# Patient Record
Sex: Female | Born: 1935 | Race: White | Hispanic: No | Smoking: Never smoker
Health system: Southern US, Community
[De-identification: ages and names within clinical notes are randomized; demographics above are authoritative.]

## PROBLEM LIST (undated history)

## (undated) DIAGNOSIS — I10 Essential (primary) hypertension: Secondary | ICD-10-CM

## (undated) DIAGNOSIS — I4891 Unspecified atrial fibrillation: Secondary | ICD-10-CM

## (undated) DIAGNOSIS — G4733 Obstructive sleep apnea (adult) (pediatric): Secondary | ICD-10-CM

## (undated) HISTORY — PX: VASCULAR SURGERY: SHX849

## (undated) HISTORY — PX: ABDOMINAL HYSTERECTOMY: SHX81

## (undated) HISTORY — PX: TOTAL KNEE ARTHROPLASTY: SHX125

---

## 1998-07-14 ENCOUNTER — Ambulatory Visit (HOSPITAL_COMMUNITY): Admission: RE | Admit: 1998-07-14 | Discharge: 1998-07-14 | Payer: Self-pay | Admitting: *Deleted

## 2000-01-12 ENCOUNTER — Encounter: Payer: Self-pay | Admitting: *Deleted

## 2000-01-12 ENCOUNTER — Encounter: Admission: RE | Admit: 2000-01-12 | Discharge: 2000-01-12 | Payer: Self-pay | Admitting: *Deleted

## 2001-02-09 ENCOUNTER — Other Ambulatory Visit: Admission: RE | Admit: 2001-02-09 | Discharge: 2001-02-09 | Payer: Self-pay | Admitting: Internal Medicine

## 2002-09-24 ENCOUNTER — Encounter: Payer: Self-pay | Admitting: Emergency Medicine

## 2002-09-24 ENCOUNTER — Emergency Department (HOSPITAL_COMMUNITY): Admission: EM | Admit: 2002-09-24 | Discharge: 2002-09-24 | Payer: Self-pay | Admitting: Emergency Medicine

## 2002-11-18 ENCOUNTER — Inpatient Hospital Stay (HOSPITAL_COMMUNITY): Admission: RE | Admit: 2002-11-18 | Discharge: 2002-11-22 | Payer: Self-pay | Admitting: Orthopedic Surgery

## 2002-11-29 ENCOUNTER — Ambulatory Visit: Admission: RE | Admit: 2002-11-29 | Discharge: 2002-11-29 | Payer: Self-pay | Admitting: Orthopedic Surgery

## 2003-11-19 ENCOUNTER — Ambulatory Visit (HOSPITAL_COMMUNITY): Admission: RE | Admit: 2003-11-19 | Discharge: 2003-11-19 | Payer: Self-pay | Admitting: Orthopedic Surgery

## 2004-09-08 ENCOUNTER — Ambulatory Visit: Payer: Self-pay | Admitting: Cardiology

## 2005-02-02 ENCOUNTER — Inpatient Hospital Stay (HOSPITAL_COMMUNITY): Admission: RE | Admit: 2005-02-02 | Discharge: 2005-02-07 | Payer: Self-pay | Admitting: Orthopedic Surgery

## 2010-06-25 ENCOUNTER — Ambulatory Visit: Payer: Self-pay | Admitting: Diagnostic Radiology

## 2010-06-25 ENCOUNTER — Emergency Department (HOSPITAL_BASED_OUTPATIENT_CLINIC_OR_DEPARTMENT_OTHER): Admission: EM | Admit: 2010-06-25 | Discharge: 2010-06-25 | Payer: Self-pay | Admitting: Emergency Medicine

## 2010-12-23 LAB — CBC
HCT: 44.5 % (ref 36.0–46.0)
Hemoglobin: 15.2 g/dL — ABNORMAL HIGH (ref 12.0–15.0)
MCH: 31.4 pg (ref 26.0–34.0)
MCV: 92 fL (ref 78.0–100.0)
Platelets: 240 10*3/uL (ref 150–400)
RBC: 4.84 MIL/uL (ref 3.87–5.11)
WBC: 7.4 10*3/uL (ref 4.0–10.5)

## 2010-12-23 LAB — BASIC METABOLIC PANEL
BUN: 20 mg/dL (ref 6–23)
CO2: 23 mEq/L (ref 19–32)
Calcium: 9 mg/dL (ref 8.4–10.5)
Chloride: 106 mEq/L (ref 96–112)
Creatinine, Ser: 0.9 mg/dL (ref 0.4–1.2)
GFR calc Af Amer: >60 mL/min (ref >60)
GFR calc non Af Amer: 59 mL/min — ABNORMAL LOW (ref >60)
Glucose, Bld: 93 mg/dL (ref 70–99)
Potassium: 4.8 mEq/L (ref 3.5–5.1)
Sodium: 141 mEq/L (ref 135–145)

## 2010-12-23 LAB — DIFFERENTIAL
Eosinophils Relative: 3 % (ref 0–5)
Lymphocytes Relative: 26 % (ref 12–46)
Lymphs Abs: 1.9 10*3/uL (ref 0.7–4.0)
Monocytes Absolute: 0.6 10*3/uL (ref 0.1–1.0)
Monocytes Relative: 8 % (ref 3–12)

## 2010-12-23 LAB — POCT B-TYPE NATRIURETIC PEPTIDE (BNP): B Natriuretic Peptide, POC: 271 pg/mL — ABNORMAL HIGH (ref 0–100)

## 2010-12-23 LAB — POCT CARDIAC MARKERS
CKMB, poc: <1 ng/mL — ABNORMAL LOW (ref 1.0–8.0)
Troponin i, poc: <0.05 ng/mL (ref 0.00–0.09)

## 2011-02-25 NOTE — H&P (Signed)
Pamela Moody, Pamela Moody            ACCOUNT NO.:  [id number]   MEDICAL RECORD NO.:  [id number]          PATIENT TYPE:  INP   LOCATION:  0468                         FACILITY:  [facility]   PHYSICIAN:  Ollen Gross, M.D.    DATE OF BIRTH:  1921-11-17   DATE OF ADMISSION:  02/02/2005  DATE OF DISCHARGE:                                HISTORY & PHYSICAL   CHIEF COMPLAINT:  Left knee pain.   HISTORY OF PRESENT ILLNESS:  This is an 77 year old female who has been seen  by Dr. Ollen Gross for ongoing knee problems. She has had a previous right  total knee about two years ago and has been quite well with it. Now she is  having continued pain with the left knee. She has known arthritis with  continued pain. She says she has done well with her previous right knee and  she would benefit from undergoing left knee replacement. Risks and benefits  discussed. The patient was subsequently admitted to the hospital.   ALLERGIES:  No known drug allergies. Intolerance to STEROIDS causing nose  swelling and MORPHINE causes nausea and vomiting.   MEDICATIONS:  1.  Diovan 80 mg daily.  2.  Tylenol Arthritis.  3.  Metamucil.   PAST MEDICAL HISTORY:  1.  Hypertension.  2.  Postmenopausal.  3.  History of headaches.  4.  Osteoporosis.   PAST SURGICAL HISTORY:  1.  Varicose vein ligation.  2.  Total hysterectomy.  3.  Right knee replacement.  4.  Right knee arthroscopy x2.   SOCIAL HISTORY:  Widowed, retired, nonsmoker, no alcohol. She lives in  independent living at [facility].   FAMILY HISTORY:  Father deceased at age 22 with a history of prostate  cancer. Mother deceased at age 72 with history of dementia. Hypertension and  arthritis run in the family.   REVIEW OF SYMPTOMS:  GENERAL:  No fever, chills, or night sweats.  NEUROLOGICAL:  No seizures, syncope, or paralysis. RESPIRATORY:  No  shortness of breath at rest. She does have some shortness of breath on  exertion. No productive cough  or hemoptysis. CARDIOVASCULAR:  No chest pain,  angina, or orthopnea. GI:  No nausea, vomiting, diarrhea, or constipation.  GU:  No dysuria, hematuria, or discharge. MUSCULOSKELETAL:  Left knee as  found in the history of present illness.   PHYSICAL EXAMINATION:  VITAL SIGNS:  Pulse 60, respirations 12, blood  pressure 132/82.  GENERAL:  An 77 year old white female well-developed, well-nourished in no  acute distress. Short in stature. She is alert, oriented, and cooperative.  HEENT:  Normocephalic and atraumatic. Oropharynx clear. EOMs intact.  NECK:  Supple. No bruits are appreciated.  CHEST:  Clear anterior and posterior chest walls. No rhonchi, rubs, or  wheezing.  HEART:  Regular rate and rhythm. No murmurs. S1 and S2. No rubs, thrills, or  palpitations.  ABDOMEN:  Soft, slightly round, nontender. Bowel sounds present.  RECTAL:  Not done, not pertinent to present illness.  BREASTS:  Not done, not pertinent to present illness.  GENITALIA:  Not done, not pertinent to present illness.  EXTREMITIES:  Left knee shows range of motion of 0 to 125, marked crepitus,  slight valgus instability.   IMPRESSION:  1.  Osteoarthritis left knee.  2.  Hypertension.  3.  Postmenopausal.  4.  Headaches.  5.  Osteoporosis.   PLAN:  The patient will be admitted to [facility] to undergo a  left total knee arthroplasty. Surgery will be performed by Dr. Ollen Gross.      ALP/MEDQ  D:  02/06/2005  T:  02/06/2005  Job:  [id number]   cc:   Patience Musca, M.D.   Ollen Gross, M.D.  [facility]  [address]  [address]  [place]  [place] [address]  Fax: [phone]

## 2011-02-25 NOTE — Discharge Summary (Signed)
NAMEROSALY, LABARBERA            ACCOUNT NO.:  [id number]   MEDICAL RECORD NO.:  [id number]          PATIENT TYPE:  INP   LOCATION:  0468                         FACILITY:  [facility]   PHYSICIAN:  Ollen Gross, M.D.    DATE OF BIRTH:  Nov 19, 1921   DATE OF ADMISSION:  02/02/2005  DATE OF DISCHARGE:  02/07/2005                                 DISCHARGE SUMMARY   ADMISSION DIAGNOSES:  1.  Osteoarthritis, left knee.  2.  Hypertension.  3.  Postmenopausal.  4.  Headaches.  5.  Osteoporosis.   DISCHARGE DIAGNOSES:  1.  Osteoarthritis, left knee with valgus deformity, status post left total      knee arthroplasty.  2.  Postoperative blood loss anemia.  Did not require transfusion.  3.  Postoperative hyponatremia, improved.  4.  Hypertension.  5.  Postmenopausal.  6.  Headaches.  7.  Osteoporosis.   PROCEDURE:  On February 02, 2005, left total knee arthroplasty.   SURGEON:  Ollen Gross, M.D.   ASSISTANT:  Nadene Rubins, PA-C.   ANESTHESIA:  General with postop Marcaine pain pump.   TOURNIQUET TIME:  Forty-one minutes.   CONSULTATIONS:  None.   BRIEF HISTORY:  Ms. Lewing is an 77 year old female with severe end-stage  arthritis of the left knee with intractable pain.  Previous successful right  total knee.  Now presents for left total knee.   LABORATORY DATA:  CBC:  Hemoglobin 14.1, CBC on admission showed a  hemoglobin of 14.1, hematocrit 41.8.  Differential within normal limits.  Postop hemoglobin 10.3.  Last noted H&H 10 and 29.9.  PT/PTT preop 12.3 and  29, respectively with an INR of 0.9.  Serial pro times were followed.  Last  noted PT/INR 19.1 and 2.  Chem panel on admission all within normal limits.  Postop BMETs were followed.  Sodium dropped from 142 to 134, back up to 137.  Glucose went up from 101 to 140, back down to 119.  Urinalysis, preop,  negative.  Blood group type B-.   EKG:  On February 01, 2005:  Normal sinus rhythm with first-degree AV block and  nonsustained atrial tachycardia.  Confirmed by Dr. Berton Mount.   HOSPITAL COURSE:  Patient was admitted to [facility] and underwent  the above procedure.  Tolerated it well.  Placed on p.o. and IV analgesics.  Was doing well by day #1 and already asking when she could go home.  She  lived at [facility], which did have a skilled area.  She went there last  time with her previous surgery but only stayed there a day or two and then  went back into independent living.  She wants to go straight back into  independent living this time.  Hemovac drain placed at the time of surgery  was pulled on day #1.  PCA was discontinued on day #1.  By day #2, she was  already feeling much better, starting to get up with physical therapy and  ambulating short distances, about 8-10 feet.  Dressing was changed.  The  incision was healing well.  Felt that she  would go to independent living at  [facility] versus their skilled area.  IV fluids were discontinued.  By  day #3, she had a little bit of increased pain, which was probably due to  being up on the knee.  No calf tenderness.  The knee appeared to be  progressing well.  By day #4, slowly progressing with physical therapy.  Ambulating approximately 10 feet twice.  It is felt that she would need a  little bit more time; therefore, her discharge was held until the following  day of Feb 07, 2005.  She was doing much better with physical therapy,  getting up and ambulating approximately 60 feet.  She felt that she had  improvement enough to where she could go back to independent living.  Patient was seen in rounds by Dr. Despina Hick and said she was feeling well, and  she was discharged home.   DISCHARGE PLAN:  1.  Patient was discharged home on Feb 07, 2005.  2.  Discharge diagnoses:  Please see above.  3.  Discharge meds:  Percocet, Robaxin, Coumadin.  4.  Diet as tolerated.  5.  Activity:  Weightbearing as tolerated.  Home health PT/OT and home       health nursing to [facility].  She may start showering.  6.  Follow up on May 9th.  Contact the office at [phone].   DISPOSITION:  Home.   CONDITION ON DISCHARGE:  Improved.      ALP/MEDQ  D:  03/11/2005  T:  03/11/2005  Job:  [id number]   cc:   Dr. Mattie Marlin

## 2011-02-25 NOTE — H&P (Signed)
NAME:  Pamela Moody, Pamela Moody                      ACCOUNT NO.:  [id number]   MEDICAL RECORD NO.:  [id number]                   PATIENT TYPE:  INP   LOCATION:  0455                                 FACILITY:  [facility]   PHYSICIAN:  Ollen Gross, M.D.                 DATE OF BIRTH:  Feb 11, 1922   DATE OF ADMISSION:  11/18/2002  DATE OF DISCHARGE:                                HISTORY & PHYSICAL   CHIEF COMPLAINT:  Right knee pain.   HISTORY OF PRESENT ILLNESS:  The patient is a 77 year old female who has  been seen by Dr. [name] for a long history of difficulties with her right  knee.  The pain in her right knee has been progressively worse over the past  six months.  She had a previous knee arthroscopy dating back to 1993 with  debridement of the lateral meniscus, and she was last seen in follow up for  her knee back approximately three years ago.  She comes back in and follows  up with Dr. [name] for progressive-type pain in the right knee.  X-rays in  the office show severe lateral compartment arthritis with patellofemoral  arthritis also.  She is noted to have severe valgus deformity.  Her past has  been progressive to the point where it is interfering with her daily  activities and her mobility.  It is felt she has reached a point where she  can benefit by undergoing surgical intervention.  Risks and benefits  discussed, and the patient subsequently admitted to the hospital.   ALLERGIES:  No known drug allergies.   INTOLERANCES:  STEROID CAUSED SOME NOSE SWELLING; NO OTHER SYMPTOMS.   CURRENT MEDICATIONS:  1. ______ 80 mg daily.  2. She was taking a baby aspirin daily; stopped prior to surgery.  3. Aleve two q.a.m.; stopped prior to surgery.   PAST MEDICAL HISTORY:  1. Hypertension, postmenopausal.  2. History of a right ankle fracture during her teenage years.   PAST SURGICAL HISTORY:  1. Varicose vein ligation in 1964, and again in 1980.  2. Abdominal hysterectomy in  1991.  3. Right knee arthroscopy in 1993.  4. Bilateral cataracts in 1992.   SOCIAL HISTORY:  She is widowed, retired.  Nonsmoker.  No alcohol.  One  child.  She lives in an independent living apartment at [facility].  The patient states they do have a skilled care inpatient unit  at [facility].  She lives in a one story home with no steps in her  independent living.   FAMILY HISTORY:  Father deceased at age 40 with history of prostate cancer.  Mother deceased at age 12 with history of dementia.  Hypertension and  arthritis run in the family.   REVIEW OF SYSTEMS:  GENERAL:  No fever, chills, night sweats.  NEUROLOGIC:  No seizures seen or paralysis.  RESPIRATORY:  No shortness of breath,  productive cough, or hemoptysis.  CARDIOVASCULAR:  No chest pain, angina,  orthopnea.  GI:  No nausea, vomiting, diarrhea, or constipation.  GU:  No  dysuria, hematuria, discharge.  MUSCULOSKELETAL:  Pertinent of the right  knee found in the history of present illness.   PHYSICAL EXAMINATION:  VITAL SIGNS:  Pulse 68, respirations 12, blood  pressure 128/66.  GENERAL:  The patient is a 77 year old female, well-nourished, well-  developed, in no acute distress.  She is alert, oriented, and cooperative.  HEENT:  Normocephalic and atraumatic.  Pupils round and reactive.  Extraocular muscles are intact.  Oropharynx is clear.  NECK:  Supple.  No carotid bruits were appreciated.  HEART:  Regular rate and rhythm.  No murmur.  ABDOMEN:  Soft, nontender.  Bowel sounds are present.  GENITALIA:  Not done; no pertinent to present illness.  EXTREMITIES:  Limited to the right lower extremity.  She has range of motion  of the right knee of 5 to 120 degrees with no instability, no effusion, no  signs of infection.  She does have a valgus malalignment deformity in and  about the right knee.   IMPRESSION:  1. Severe osteoarthritis right knee with valgus deformity.  2. Hypertension.  3.  Postmenopausal.   PLAN:  The patient will be admitted to [facility] to undergo a  right total knee replacement arthroplasty.  Risks and benefits of the  procedure have been discussed with the patient.  The surgery will be  performed by Dr. Ollen Gross.     Alexzandrew L. Julien Girt, P.A.              Ollen Gross, M.D.    ALP/MEDQ  D:  11/18/2002  T:  11/18/2002  Job:  [id number]   cc:   Ollen Gross, M.D.  [address]  [place]  [place] [address]  Fax: [phone]   [facility]  [address], [address]  [place]  [place] [address]  Fax: [phone]

## 2011-02-25 NOTE — Op Note (Signed)
NAME:  Pamela Moody, Pamela Moody                      ACCOUNT NO.:  [id number]   MEDICAL RECORD NO.:  [id number]                   PATIENT TYPE:  INP   LOCATION:  0001                                 FACILITY:  [facility]   PHYSICIAN:  Ollen Gross, M.D.                 DATE OF BIRTH:  1921-12-29   DATE OF PROCEDURE:  11/18/2002  DATE OF DISCHARGE:                                 OPERATIVE REPORT   PREOPERATIVE DIAGNOSIS:  Osteoarthritis, right knee.   POSTOPERATIVE DIAGNOSIS:  Osteoarthritis, right knee.   PROCEDURE:  Right total knee arthroplasty.   SURGEON:  Gus Rankin. Aluisio, M.D.   ASSISTANT:  Alexzandrew L. Julien Girt, P.A.   ANESTHESIA:  General.   ESTIMATED BLOOD LOSS:  Minimal.   DRAIN:  Hemovac x 1.   COMPLICATIONS:  None.   TOURNIQUET TIME:  46 minutes at 300 mmHg.   CONDITION:  Stable to recovery.   BRIEF CLINICAL NOTE:  Pamela Moody is a 77 year old female with severe end-  stage arthritis of her right knee with severe valgus deformity, who presents  now for right total knee arthroplasty.   PROCEDURE IN DETAIL:  After the successful administration of general  anesthetic, a tourniquet is placed high on the right thigh, right lower  extremity prepped and draped in the usual sterile fashion.  Extremity is  wrapped in Esmarch, knee flexed, and tourniquet inflated to 300 mmHg.  Standard midline incision is made with a 10 blade through subcutaneous  tissue to the level of the extensor mechanism.  Given her significant valgus  deformity, a lateral parapatellar arthrotomy is made.  Then the soft tissue  over the proximal and lateral tibia is elevated to the joint line with a  knife.  The patella is then everted medially, knee flexed, and the ACL and  PCL removed.  Drill is used to create a starting hole in the distal femur;  canal is irrigated, and a five degree right valgus alignment guide is  placed.  Referencing off the posterior condyles, rotation is marked and a  block  pinned to remove 10 mm off the distal femur.  Distal femoral resection  is made with an oscillating saw.  Sizing block is placed; a size 3 is the  most appropriate.  We marked rotation off the epicondylar axis, and then the  size 3 cutting block is placed and the anterior and posterior cuts made.   Tibia is subluxed forward and the menisci removed.  Extramedullary tibial  alignment guide is placed, referencing proximally at the medial aspect of  the tibial tubercle and distally along the second metatarsal axis and tibial  crest.  Block is pinned to remove 2 mm off the deficient lateral side.  Tibial resection is made with an oscillating saw.  Size 3 is the most  appropriate tibial tray, and the proximal tibia is then prepared with the  modular drill and keel punch.  The  femoral preparation is completed with the  intercondylar and chamfer cuts.   The size 3 mobile bearing tibial trial, size 3 posterior stabilized femoral  trial, and a 10 mm insert is placed.  She hyperextended a little with the  10, and we went to 12.5 which had full extension and excellent varus and  valgus balance throughout full range of motion.  The patella was everted,  thickness measured to be 23 mm, free hand resection taken to 13 mm, 38  template placed, lug holes drilled, trial patella placed, and it tracks  normally.  Osteophytes are then removed off the posterior femur with the  trials in place.   All trials are then removed and the cut bone surfaces prepared with  pulsatile lavage.  Cement is mixed and once ready for implantation, the size  3 mobile bearing tibial tray, size 3 posterior stabilized femur, and the 38  patella are cemented into place.  Patella is held with a clamp.  The 12.5 mm  trial insert is placed, knee held in full extension, and all extruded cement  removed.  Once the cement is fully hardened, then the permanent 12.5 mm  posterior stabilized rotating platform insert is placed.  The  tourniquet is  then released for the total time of 46 minutes.  Minor bleeding stopped with  cautery.  Fat pad is closed, and then the lateral arthrotomy closed with  interrupted #1 PDS.  We left the lateral release open from the superior to  the inferior pole of the patella.  The patella tracked normally with flexion  against gravity with 140 degrees.  Hemovac drain is placed and then subcu  tissue closed with interrupted 2-0 Vicryl, subcuticular with running 4-0  Monocryl.  The catheter for the Marcaine pump is then placed in the subcu  tissues.  A bulky sterile dressing is applied, and then she is subsequently  awakened and transported to recovery in stable condition.                                               Ollen Gross, M.D.    FA/MEDQ  D:  11/18/2002  T:  11/18/2002  Job:  [id number]

## 2011-02-25 NOTE — Op Note (Signed)
Pamela Pamela Moody, Pamela [name]:  [id number]   MEDICAL RECORD NO.:  [id number]          PATIENT TYPE:  INP   LOCATION:  X002                         FACILITY:  [facility]   PHYSICIAN:  Ollen Gross, M.D.    DATE OF BIRTH:  1922/04/02   DATE OF PROCEDURE:  02/02/2005  DATE OF DISCHARGE:                                 OPERATIVE REPORT   PREOPERATIVE DIAGNOSIS:  Osteoarthritis left knee with valgus deformity.   POSTOPERATIVE DIAGNOSIS:  Osteoarthritis left knee with valgus deformity.   PROCEDURE:  Left total knee arthroplasty.   SURGEON:  Ollen Gross, M.D.   ASSISTANT:  Pamela Pamela Moody.   ANESTHESIA:  General with postop Marcaine pain pump.   ESTIMATED BLOOD LOSS:  Minimal.   DRAIN:  Hemovac x1.   TOURNIQUET TIME:  41 minutes at 300 mmHg.   COMPLICATIONS:  None.   CONDITION:  Stable to recovery.   BRIEF CLINICAL NOTE:  Pamela Pamela Moody is an 77 year old female with severe end-  stage arthritic change of the left knee with intractable pain.  She has had  a previous successful right total knee arthroplasty and presents now for  left total knee arthroplasty.   PROCEDURE IN DETAIL:  After successful administration of general anesthetic,  a tourniquet was placed on the thigh and left lower extremity prepped and  draped in the usual sterile fashion.  Extremities were wrapped in Esmarch,  the knee flexed and tourniquet inflated to 300 mmHg.  A standard midline  incision was made with a 10 blade through the subcutaneous tissue to the  level of the extensor mechanism.  A fresh blade was used to make a lateral  parapatellar arthrotomy given the valgus deformity.  Soft tissue over the  proximal lateral tibia is subperiosteally elevated onto the joint margin  with a knife.  Patella was then everted medially and the knee flexed 90  degrees.  ACL and PCL were removed.  A drill was used to create a starting  hole in the distal femur and the canal was irrigated.  A 5 degree  left  valgus alignment guide is placed and representing off the posterior  condyles, rotations marked in a black pen to remove 10 mm of the distal  femur.  Distal femoral resection is made with an oscillating saw.  A sizing  block is placed and size 3 is most appropriate.  Rotation is marked at the  epicondylar axis.  A size 3 cutting block is placed and an anterior,  posterior and Chamfer cuts made for the size 3.   Tibia subluxed forward and menisci removed.  Extramedullary tibial alignment  guide was placed, surfacing proximally at the medial aspect of the tibial  tubercle and distally along the second metatarsal axis of the tibial crest.  The block is pinned to remove about 6 mm off the medial side which is  slightly deficient, corresponding to about 4 mm off the deficient lateral  side.  Tibial resection is made with an oscillating saw.  A size 3 is the  most appropriate tibial component and the proximal tibial is prepared with a  modular  drill and keyhole punch for a size 3.  Femoral preparation was  completed with intercondylar cut.   A size 3 mobile bearing tibial trial, size 3 posterior stabilized femoral  trial, and a 10 mm posterior stabilized rotating platform insert trial  placed.  With a 10, full extension is achieved with excellent varus and  valgus balance throughout full range of motion.  Patella is everted with  thickness measured to be 22 mm.  Free hand resection is taken to 12 mm, a 38  template is placed, locals are drilled, trial patella is placed and it  tracks normally.  The osteophytes were then removed off the posterior femur  with a trial in place.  All trials were removed and the cut bone surfaces  are repaired with pulsatile lavage.  Cement is mixed and once ready for  implantation the size 3 mobile bearing tibial tray, size 3 posterior  stabilized femur and 38 patella are cemented into place and the patella is  held with the clamp.  A trial 10 mm insert is  placed, knee held in full  extension and all extruded cement removed.  Once the cement was fully  hardened then the permanent 10 mm posterior stabilized rotating platform  insert was placed into the tibial tray.  The wound was copiously irrigated  with saline solution.  Tourniquet was placed with a total time of 41  minutes.  Minor bleeding stopped with cautery.  Lateral arthrotomy was  closed over a Hemovac drain with interrupted #1 PDS.  We left open the area  from the superior to the inferior pole of the patella to serve as a  mediolateral release.  Flexion against gravity was 135 degrees.  Subcu was  closed with interrupted 2-0 Vicryl and subcuticular running 4-0 Monocryl.  The drain was hooked to suction.  The catheter for the Marcaine pain pump is  placed then the pump initiated.  Steri-Strips and a bulky sterile dressing  were applied and then she was placed into a knee immobilizer, awakened and  transported to recovery in stable condition.      FA/MEDQ  D:  02/02/2005  T:  02/02/2005  Job:  [id number]

## 2011-02-25 NOTE — Op Note (Signed)
NAME:  Pamela Moody, Pamela Moody                      ACCOUNT NO.:  [id number]   MEDICAL RECORD NO.:  [id number]                   PATIENT TYPE:  AMB   LOCATION:  DAY                                  FACILITY:  [facility]   PHYSICIAN:  Ollen Gross, M.D.                 DATE OF BIRTH:  1922/09/25   DATE OF PROCEDURE:  11/19/2003  DATE OF DISCHARGE:                                 OPERATIVE REPORT   PREOPERATIVE DIAGNOSIS:  Patellar clunk syndrome, right knee.   POSTOPERATIVE DIAGNOSIS:  Patellar clunk syndrome, right knee.   PROCEDURE:  Right knee arthroscopy with synovial debridement.   SURGEON:  Gus Rankin. Aluisio, M.D.   ASSISTANT:  None.   ANESTHESIA:  General.   ESTIMATED BLOOD LOSS:  Minimal.   DRAIN:  Hemovac x 1.   COMPLICATIONS:  None.   CONDITION:  Stable to recovery.   BRIEF CLINICAL NOTE:  [name] is a 77 year old female, who had a right  total knee arthroplasty approximately a year ago with excellent result.  For  the past six months or so, however, she has noticed a popping when she gets  up from a chair or flexes the knee right at 30 degrees.  She still has  fantastic motion with flexion approximately 125 degrees.  She does have a  particular spot where it hurts and pops.  All of the components are in  excellent position.  There are no periprosthetic abnormalities.  She has  exam and history consistent with patellar clunk syndrome and presents now  for arthroscopy and synovial debridement.   PROCEDURE IN DETAIL:  After successful administration of general anesthetic,  a tourniquet is placed high on the right thigh and right lower extremity  prepped and draped in the usual sterile fashion.  Superomedial and  inferolateral incisions are made, inflow cannula passed superomedial, and  camera passed inferolateral.  Arthroscopic visualization proceeds.  There is  no evidence of any abnormality of the component interface at the femur.  There was a significant synovial  hyperplasia just superior to the patella  consistent with the changes found in patellar clunk syndrome.  I localized  the spot in the inferomedial portal with a spinal needle and made a small  incision and got the dilator in.  We were unable to gain access sufficiently  to the suprapatellar area through that portal, thus I hade to make a  superolateral portal and using a combination of a 4.2 shaver and the  ArthroCare device, we debrided the hypertrophic synovium back to where  healthy-appearing tendon was showing with no evidence of any overlying  synovium.  We did this throughout the suprapatellar area.  I then replaced  the __________ with the arthroscopic footplate and put it through a range of  motion; there was no more popping.  The superolateral and 2 inch superior  incisions then closed with interrupted 4-0 nylon, then 20 mL of 0.25%  Marcaine injected through the inflow  cannula.  Hemovac drain was placed; the cannula was removed, and the  incision closed with the drain not sewn in.  We then placed the Hemovac  drain to the canister but could not apply suction.  Bulky sterile dressing  was then applied, and she was subsequently awakened and transported to  recovery room in stable condition.                                               Ollen Gross, M.D.    FA/MEDQ  D:  11/19/2003  T:  11/19/2003  Job:  [id number]

## 2011-02-25 NOTE — Discharge Summary (Signed)
NAME:  Pamela Moody, Pamela Moody                      ACCOUNT NO.:  [id number]   MEDICAL RECORD NO.:  [id number]                   PATIENT TYPE:  INP   LOCATION:  0455                                 FACILITY:  [facility]   PHYSICIAN:  Ollen Gross, M.D.                 DATE OF BIRTH:  07/26/1922   DATE OF ADMISSION:  11/18/2002  DATE OF DISCHARGE:  11/22/2002                                 DISCHARGE SUMMARY   ADMISSION DIAGNOSES:  1. Severe osteoarthritis right knee with valgus deformity.  2. Hypertension.  3. Postmenopausal.   DISCHARGE DIAGNOSES:  1. Osteoarthritis right knee status post right total knee replacement     arthroplasty.  2. Mild postoperative blood loss anemia (did not require transfusion).  3. Hypertension.  4. Postmenopausal.   PROCEDURE:  The patient was taken to the OR on November 18, 2002, underwent a  right total knee replacement arthroplasty. Surgeon, Dr. Ollen Gross,  assistant Avel Peace P.A.-C.  The surgery was done under general  anesthesia with minimal blood loss, Hemovac drain x1, tourniquet time 46  minutes at 300 mmHg.   CONSULTATIONS:  None.   BRIEF HISTORY:  The patient is a 77 year old female who has been seen by Dr.  Lequita Moody for a long history of right knee pain. The pain has been  progressively worse over the past six months. He has had a previous knee  scope dating back to 1993 with debridement of the lateral meniscus. She has  been seen in follow up for progressive type knee pain. X-rays show it has  progressed to severe lateral compartment arthritis but also patellofemoral  arthritis. It is felt she has reached a point where she can benefit from  undergoing a total knee replacement arthroplasty. The risks and benefits  were discussed and the patient subsequently admitted to the hospital.   LABORATORY DATA:  CBC on admission showed a hemoglobin of 14.9, hematocrit  of 43.3, white cell count 7.3, red cell count 4.81, differential all within  normal limits. Postop H&H 12.3 and 36.1. Last noted H&H 10.7 and 31.6. PT  and PTT on admission were 13.0 and 29 respectively. Serial pro times  followed per Coumadin protocol. Last noted PT/INR at the time of discharge  was 24.5 and 2.6. Chem panel on admission all within normal limits. Follow  up BMET showed an increase in glucose to 207, however, glucose came back  down to 130. Calcium dropped from 9.3 to 8.2, remaining electrolytes within  normal limits. Urinalysis on admission was negative with the exception of  only small leukocyte esterase with 0-2 white cells, 0-3 red cells. Blood  group type B negative.   Chest x-ray dated September 24, 2002, cardiomegaly with mild vascular  congestion but no over CHF or focal infiltrates.   EKG dated September 24, 2002 marked sinus bradycardia with first degree AV  block, abnormal EKG, normal tracings to compare confirmed by Dr. Molly Maduro  Sevier.   HOSPITAL COURSE:  The patient was admitted to [facility], taken to  OR, underwent above stated procedure without complications. The patient  tolerated the procedure well, later transferred to the recovery room and  then to the orthopedic floor for continued postoperative care. Vital signs  were followed. The patient was placed on PCA analgesics for pain control  following surgery. Hemovac drain which was placed at the time of surgery was  left in until postoperative day two. Hemovac drain and a pain pump inflow  tube both were pulled on postoperative day two without difficulty. On that  day also the dressing was changed and incision was healing well. Over 48  hours, she was weaned off from [facility] analgesics over to p.o. medications. PCA  was also discontinued. PT and OT were consulted to assist with gait  training, ambulation and ADLs. The patient did progress well with physical  therapy and was up ambulating approximately 100 feet by postoperative day  two and even up to 160 feet by postoperative  day three. The patient was in  independent living out at [facility]. She had already spoke with the  facility preoperatively and arranged for assistance in their skilled rehab  region or area of the facility. Discharge planning was consulted and  assisted with arrangements post hospital care. She did develop some nausea  on the evening of the first day and also the second day. Medications were  switched around and she was placed on Reglan, also Zofran for breakthrough  nausea and vomiting. Her nausea did improve. This continued to improve until  November 22, 2002. She was doing quite well, ambulating well and her bed was  available at [facility] and she was discharged to the skilled unit [facility].   DISCHARGE PLAN:  1. The patient is discharged to [facility] skilled unit on November 22, 2002.  2. Discharge diagnosis, please see above.  3. Discharge medications--Current medications at time of transfer include:     a. Colace 100 mg p.o. b.i.d.     b. Avapro 150 mg p.o. q.d.     c. Coumadin as per pharmacy protocol.     d. Reglan 10 mg p.o. a.c. and h.s. p.r.n.     e. Laxative and enema of choice.     f. Percocet 1 or 2 every 4-6h as needed for pain.     g. Tylenol 1 or 2 every 4-6h as needed for mild pain, temperature or        headache.     h. Robaxin 500 mg p.o. q. 6-8h p.r.n. spasm.        a. Restoril 15-30 mg p.o. q.h.s.     i. Zofran 4 mg p.o. q. 6h p.r.n. nausea.  4. Diet--low sodium diet.  5. Activity--She is weightbearing as tolerated to the right lower extremity.     She will need to continue with total knee protocol with physical therapy,     occupational therapy for gait training, ambulation, range of motion,     strengthening and also ADLs. She may discontinue her knee immobilizer at     this time seeing how she is already able to perform straight leg raises.     She may start showering as of today November 22, 2002. 6. Coumadin. She will need three  weeks of postoperative Coumadin for DVT     prophylaxis. We will ask the medical staff that follows the skill unit  at     [facility] and see if they will cover and manage her DVT management.     We recommend pro times twice a week on Monday and Thursday to be drawn.     If arrangements are unable to be with medical staff at [facility], we     will have the pro times faxed over to [facility] and     have them followed by Dr. Lequita Moody.    DISPOSITION:  Skilled unit at [facility].   CONDITION ON DISCHARGE:  Improved.      Alexzandrew L. Julien Girt, P.A.              Ollen Gross, M.D.    ALP/MEDQ  D:  11/22/2002  T:  11/22/2002  Job:  [id number]   cc:   Newt Minion  [address], [place]  [address]  [place] [address]  Fax: [phone]

## 2012-08-01 ENCOUNTER — Emergency Department (HOSPITAL_BASED_OUTPATIENT_CLINIC_OR_DEPARTMENT_OTHER): Payer: Medicare Other

## 2012-08-01 ENCOUNTER — Emergency Department (HOSPITAL_BASED_OUTPATIENT_CLINIC_OR_DEPARTMENT_OTHER)
Admission: EM | Admit: 2012-08-01 | Discharge: 2012-08-01 | Disposition: A | Discharge status: Home / Self Care | Payer: Medicare Other | Attending: Emergency Medicine | Admitting: Emergency Medicine

## 2012-08-01 ENCOUNTER — Encounter (HOSPITAL_BASED_OUTPATIENT_CLINIC_OR_DEPARTMENT_OTHER): Payer: Self-pay | Admitting: Emergency Medicine

## 2012-08-01 DIAGNOSIS — S0010XA Contusion of unspecified eyelid and periocular area, initial encounter: Secondary | ICD-10-CM | POA: Insufficient documentation

## 2012-08-01 DIAGNOSIS — Z96659 Presence of unspecified artificial knee joint: Secondary | ICD-10-CM | POA: Insufficient documentation

## 2012-08-01 DIAGNOSIS — I4891 Unspecified atrial fibrillation: Secondary | ICD-10-CM | POA: Insufficient documentation

## 2012-08-01 DIAGNOSIS — Z7901 Long term (current) use of anticoagulants: Secondary | ICD-10-CM | POA: Insufficient documentation

## 2012-08-01 DIAGNOSIS — R296 Repeated falls: Secondary | ICD-10-CM | POA: Insufficient documentation

## 2012-08-01 DIAGNOSIS — S8000XA Contusion of unspecified knee, initial encounter: Secondary | ICD-10-CM

## 2012-08-01 DIAGNOSIS — S0003XA Contusion of scalp, initial encounter: Secondary | ICD-10-CM | POA: Insufficient documentation

## 2012-08-01 DIAGNOSIS — W19XXXA Unspecified fall, initial encounter: Secondary | ICD-10-CM

## 2012-08-01 DIAGNOSIS — Y929 Unspecified place or not applicable: Secondary | ICD-10-CM | POA: Insufficient documentation

## 2012-08-01 DIAGNOSIS — Y9301 Activity, walking, marching and hiking: Secondary | ICD-10-CM | POA: Insufficient documentation

## 2012-08-01 DIAGNOSIS — S0083XA Contusion of other part of head, initial encounter: Secondary | ICD-10-CM

## 2012-08-01 DIAGNOSIS — I10 Essential (primary) hypertension: Secondary | ICD-10-CM | POA: Insufficient documentation

## 2012-08-01 HISTORY — DX: Unspecified atrial fibrillation: I48.91

## 2012-08-01 HISTORY — DX: Essential (primary) hypertension: I10

## 2012-08-01 NOTE — ED Provider Notes (Signed)
History     CSN: [id number]  Arrival date & time 08/01/12  1025   First MD Initiated Contact with Patient 08/01/12 1126      Chief Complaint  Patient presents with  . Fall  . Leg Pain    Rt leg r/t fall  . Black Eye    r/t fall    (Consider location/radiation/quality/duration/timing/severity/associated sxs/prior treatment) HPI Comments: Patient tripped and fell at home yesterday.  She struck her head on the wall but there was no loc.  She also injured both knees that have undergone previous tkr's.  She is now having bruising around her right eye.  No visual complaints.  No headaches or neck pain.  Patient is a 78 y.o. female presenting with fall. The history is provided by the patient.  Fall The accident occurred yesterday. The fall occurred while walking. She fell from a height of 1 to 2 ft. She landed on a hard floor. There was no blood loss. The point of impact was the head (both knees). The pain is present in the head (both knees). The pain is moderate. She was ambulatory at the scene. Pertinent negatives include no visual change, no abdominal pain, no vomiting and no headaches. The symptoms are aggravated by activity and standing. She has tried nothing for the symptoms.    Past Medical History  Diagnosis Date  . Hypertension   . Atrial fibrillation     Past Surgical History  Procedure Date  . Abdominal hysterectomy   . Total knee arthroplasty     bilat.   . Vascular surgery     vericous vein surgery    No family history on file.  History  Substance Use Topics  . Smoking status: Never Smoker   . Smokeless tobacco: Not on file  . Alcohol Use: No    OB History    Grav Para Term Preterm Abortions TAB SAB Ect Mult Living                  Review of Systems  Gastrointestinal: Negative for vomiting and abdominal pain.  Neurological: Negative for headaches.  All other systems reviewed and are negative.    Allergies  Sulfa antibiotics  Home Medications    No current outpatient prescriptions on file.  BP 127/85  Pulse 117  Temp 98.1 F (36.7 C) (Oral)  Resp 16  Ht 5\' 1"  (1.549 m)  Wt 213 lb (96.616 kg)  BMI 40.25 kg/m2  SpO2 96%  Physical Exam  Nursing note and vitals reviewed. Constitutional: She is oriented to person, place, and time. She appears well-developed and well-nourished.  HENT:       There is a contusion to the right forehead along with a periorbital ecchyomosis of the same.    Eyes: EOM are normal. Pupils are equal, round, and reactive to light.  Neck: Normal range of motion. Neck supple.  Cardiovascular: Normal rate and regular rhythm.   No murmur heard. Pulmonary/Chest: Effort normal and breath sounds normal. No respiratory distress.  Abdominal: Soft. Bowel sounds are normal. She exhibits no distension. There is no tenderness.  Musculoskeletal:       Both knees have prior surgical scarring.   There is no obvious abnormality and no effusion.  They appear stable and range of motion is without significant discomfort.  There is no crepitus.  Neurological: She is alert and oriented to person, place, and time.  Skin: Skin is warm and dry.    ED Course  Procedures (including  critical care time)  Labs Reviewed - No data to display Ct Head Wo Contrast  08/01/2012  *RADIOLOGY REPORT*  Clinical Data: Fall.  CT HEAD WITHOUT CONTRAST  Technique:  Contiguous axial images were obtained from the base of the skull through the vertex without contrast.  Comparison: None.  Findings: Right frontal scalp hematoma.  Negative for skull fracture.  There is a fracture of the nasal bone which is probably chronic.  There is cerebral atrophy.  Mild chronic microvascular ischemic changes.  No acute infarct.  Negative for hemorrhage.  IMPRESSION: Atrophy and chronic microvascular ischemia.  No acute intracranial abnormality.   Original Report Authenticated By: Camelia Phenes, M.D.    Dg Knee Complete 4 Views Left  08/01/2012  *RADIOLOGY  REPORT*  Clinical Data: Fall yesterday with bilateral anterior knee pain.  LEFT KNEE - COMPLETE 4+ VIEW  Comparison: None.  Findings: Mild osteopenia.  Status post knee arthroplasty.  No acute fracture or hardware complication. No joint effusion. Vascular calcifications.  IMPRESSION: Left knee arthroplasty, without acute finding.   Original Report Authenticated By: Consuello Bossier, M.D.    Dg Knee Complete 4 Views Right  08/01/2012  *RADIOLOGY REPORT*  Clinical Data: Fall yesterday with bilateral anterior knee pain.  RIGHT KNEE - COMPLETE 4+ VIEW  Comparison: None.  Findings: Mild osteopenia.  Total knee arthroplasty. No acute hardware complication.  No acute fracture or dislocation.  Vascular calcifications.  No definite joint effusion.  Somewhat limited sensitivity secondary patient body habitus.  IMPRESSION: Right knee arthroplasty, without acute osseous finding.   Original Report Authenticated By: Consuello Bossier, M.D.      1. Fall   2. Forehead contusion   3. Knee contusion       MDM  As she is on coumadin and struck her head, a ct was performed and was negative for blood.  The xrays of the knees were unremarkable.  She appears well and I believe is stable for discharge.        Geoffery Lyons, MD 08/01/12 1256

## 2012-08-01 NOTE — ED Notes (Signed)
Pt c/o fall yesterday am. Pt c/o "goosegg" and Rt black eye, Rt leg pain, and Lt wrist pain r/t fall. Pt denies LOC. Pt states currently on blood thinner.

## 2012-08-01 NOTE — ED Notes (Signed)
Patient transported to X-ray and CT 

## 2012-08-01 NOTE — ED Notes (Signed)
No change in assessment.  Pt d/c home and stable upon d/c

## 2012-08-01 NOTE — ED Notes (Signed)
MD at bedside. 

## 2020-12-31 ENCOUNTER — Encounter (HOSPITAL_COMMUNITY): Payer: Self-pay

## 2020-12-31 ENCOUNTER — Emergency Department (HOSPITAL_COMMUNITY): Payer: Medicare Other

## 2020-12-31 ENCOUNTER — Other Ambulatory Visit: Payer: Self-pay

## 2020-12-31 ENCOUNTER — Inpatient Hospital Stay (HOSPITAL_COMMUNITY): Payer: Medicare Other

## 2020-12-31 ENCOUNTER — Inpatient Hospital Stay (HOSPITAL_COMMUNITY)
Admission: EM | Admit: 2020-12-31 | Discharge: 2021-01-02 | DRG: 853 | Disposition: A | Discharge status: Home / Self Care | Payer: Medicare Other | Attending: Internal Medicine | Admitting: Internal Medicine

## 2020-12-31 DIAGNOSIS — I5022 Chronic systolic (congestive) heart failure: Secondary | ICD-10-CM | POA: Diagnosis present

## 2020-12-31 DIAGNOSIS — I4821 Permanent atrial fibrillation: Secondary | ICD-10-CM | POA: Diagnosis present

## 2020-12-31 DIAGNOSIS — I10 Essential (primary) hypertension: Secondary | ICD-10-CM

## 2020-12-31 DIAGNOSIS — I447 Left bundle-branch block, unspecified: Secondary | ICD-10-CM

## 2020-12-31 DIAGNOSIS — G4733 Obstructive sleep apnea (adult) (pediatric): Secondary | ICD-10-CM | POA: Diagnosis present

## 2020-12-31 DIAGNOSIS — Z7901 Long term (current) use of anticoagulants: Secondary | ICD-10-CM

## 2020-12-31 DIAGNOSIS — Z20822 Contact with and (suspected) exposure to covid-19: Secondary | ICD-10-CM | POA: Diagnosis present

## 2020-12-31 DIAGNOSIS — I110 Hypertensive heart disease with heart failure: Secondary | ICD-10-CM | POA: Diagnosis present

## 2020-12-31 DIAGNOSIS — R0600 Dyspnea, unspecified: Secondary | ICD-10-CM

## 2020-12-31 DIAGNOSIS — Z66 Do not resuscitate: Secondary | ICD-10-CM | POA: Diagnosis present

## 2020-12-31 DIAGNOSIS — Z885 Allergy status to narcotic agent status: Secondary | ICD-10-CM

## 2020-12-31 DIAGNOSIS — I429 Cardiomyopathy, unspecified: Secondary | ICD-10-CM | POA: Diagnosis present

## 2020-12-31 DIAGNOSIS — R1031 Right lower quadrant pain: Secondary | ICD-10-CM | POA: Diagnosis present

## 2020-12-31 DIAGNOSIS — I482 Chronic atrial fibrillation, unspecified: Secondary | ICD-10-CM | POA: Diagnosis not present

## 2020-12-31 DIAGNOSIS — I700 Atherosclerosis of aorta: Secondary | ICD-10-CM | POA: Diagnosis present

## 2020-12-31 DIAGNOSIS — Z9119 Patient's noncompliance with other medical treatment and regimen: Secondary | ICD-10-CM

## 2020-12-31 DIAGNOSIS — J9690 Respiratory failure, unspecified, unspecified whether with hypoxia or hypercapnia: Secondary | ICD-10-CM

## 2020-12-31 DIAGNOSIS — K37 Unspecified appendicitis: Secondary | ICD-10-CM

## 2020-12-31 DIAGNOSIS — Z888 Allergy status to other drugs, medicaments and biological substances status: Secondary | ICD-10-CM

## 2020-12-31 DIAGNOSIS — A419 Sepsis, unspecified organism: Principal | ICD-10-CM | POA: Diagnosis present

## 2020-12-31 DIAGNOSIS — K3531 Acute appendicitis with localized peritonitis and gangrene, without perforation: Secondary | ICD-10-CM | POA: Diagnosis present

## 2020-12-31 DIAGNOSIS — K3589 Other acute appendicitis without perforation or gangrene: Secondary | ICD-10-CM | POA: Diagnosis not present

## 2020-12-31 DIAGNOSIS — Z96653 Presence of artificial knee joint, bilateral: Secondary | ICD-10-CM | POA: Diagnosis present

## 2020-12-31 DIAGNOSIS — Z6841 Body Mass Index (BMI) 40.0 and over, adult: Secondary | ICD-10-CM | POA: Diagnosis not present

## 2020-12-31 DIAGNOSIS — Z882 Allergy status to sulfonamides status: Secondary | ICD-10-CM | POA: Diagnosis not present

## 2020-12-31 DIAGNOSIS — I361 Nonrheumatic tricuspid (valve) insufficiency: Secondary | ICD-10-CM

## 2020-12-31 DIAGNOSIS — K353 Acute appendicitis with localized peritonitis, without perforation or gangrene: Secondary | ICD-10-CM

## 2020-12-31 DIAGNOSIS — J9601 Acute respiratory failure with hypoxia: Secondary | ICD-10-CM | POA: Diagnosis present

## 2020-12-31 DIAGNOSIS — K3580 Unspecified acute appendicitis: Secondary | ICD-10-CM

## 2020-12-31 HISTORY — DX: Obstructive sleep apnea (adult) (pediatric): G47.33

## 2020-12-31 LAB — URINALYSIS, ROUTINE W REFLEX MICROSCOPIC
Bilirubin Urine: NEGATIVE
Glucose, UA: NEGATIVE mg/dL
Hgb urine dipstick: NEGATIVE
Ketones, ur: NEGATIVE mg/dL
Leukocytes,Ua: NEGATIVE
Nitrite: NEGATIVE
Protein, ur: NEGATIVE mg/dL
Specific Gravity, Urine: 1.004 — ABNORMAL LOW (ref 1.005–1.030)
pH: 7 (ref 5.0–8.0)

## 2020-12-31 LAB — ECHOCARDIOGRAM COMPLETE
Area-P 1/2: 4.31 cm2
Height: 61 in
S' Lateral: 3.93 cm
Weight: 3421.54 oz

## 2020-12-31 LAB — LIPID PANEL
Cholesterol: 183 mg/dL (ref 0–200)
HDL: 65 mg/dL (ref >40)
LDL Cholesterol: 110 mg/dL — ABNORMAL HIGH (ref 0–99)
Total CHOL/HDL Ratio: 2.8 RATIO
Triglycerides: 38 mg/dL (ref <150)
VLDL: 8 mg/dL (ref 0–40)

## 2020-12-31 LAB — CBC WITH DIFFERENTIAL/PLATELET
Abs Immature Granulocytes: 0.07 10*3/uL (ref 0.00–0.07)
Basophils Absolute: 0 10*3/uL (ref 0.0–0.1)
Basophils Relative: 0 %
Eosinophils Absolute: 0 10*3/uL (ref 0.0–0.5)
Eosinophils Relative: 0 %
HCT: 48.2 % — ABNORMAL HIGH (ref 36.0–46.0)
Hemoglobin: 15.5 g/dL — ABNORMAL HIGH (ref 12.0–15.0)
Immature Granulocytes: 1 %
Lymphocytes Relative: 9 %
Lymphs Abs: 1.2 10*3/uL (ref 0.7–4.0)
MCH: 30.9 pg (ref 26.0–34.0)
MCHC: 32.2 g/dL (ref 30.0–36.0)
MCV: 96.2 fL (ref 80.0–100.0)
Monocytes Absolute: 1.3 10*3/uL — ABNORMAL HIGH (ref 0.1–1.0)
Monocytes Relative: 10 %
Neutro Abs: 11 10*3/uL — ABNORMAL HIGH (ref 1.7–7.7)
Neutrophils Relative %: 80 %
Platelets: 225 10*3/uL (ref 150–400)
RBC: 5.01 MIL/uL (ref 3.87–5.11)
RDW: 14 % (ref 11.5–15.5)
WBC: 13.6 10*3/uL — ABNORMAL HIGH (ref 4.0–10.5)
nRBC: 0 % (ref 0.0–0.2)

## 2020-12-31 LAB — COMPREHENSIVE METABOLIC PANEL
ALT: 9 U/L (ref 0–44)
AST: 26 U/L (ref 15–41)
Albumin: 4.1 g/dL (ref 3.5–5.0)
Alkaline Phosphatase: 56 U/L (ref 38–126)
Anion gap: 9 (ref 5–15)
BUN: 16 mg/dL (ref 8–23)
CO2: 25 mmol/L (ref 22–32)
Calcium: 9.1 mg/dL (ref 8.9–10.3)
Chloride: 103 mmol/L (ref 98–111)
Creatinine, Ser: 0.95 mg/dL (ref 0.44–1.00)
GFR, Estimated: 55 mL/min — ABNORMAL LOW (ref >60)
Glucose, Bld: 120 mg/dL — ABNORMAL HIGH (ref 70–99)
Potassium: 4.8 mmol/L (ref 3.5–5.1)
Sodium: 137 mmol/L (ref 135–145)
Total Bilirubin: 2.2 mg/dL — ABNORMAL HIGH (ref 0.3–1.2)
Total Protein: 7.6 g/dL (ref 6.5–8.1)

## 2020-12-31 LAB — LACTIC ACID, PLASMA
Lactic Acid, Venous: 1.5 mmol/L (ref 0.5–1.9)
Lactic Acid, Venous: 0.8 mmol/L (ref 0.5–1.9)

## 2020-12-31 LAB — RESP PANEL BY RT-PCR (FLU A&B, COVID) ARPGX2
Influenza A by PCR: NEGATIVE
Influenza B by PCR: NEGATIVE
SARS Coronavirus 2 by RT PCR: NEGATIVE

## 2020-12-31 LAB — I-STAT BETA HCG BLOOD, ED (MC, WL, AP ONLY): I-stat hCG, quantitative: <5 m[IU]/mL (ref <5)

## 2020-12-31 LAB — LIPASE, BLOOD: Lipase: 28 U/L (ref 11–51)

## 2020-12-31 LAB — MAGNESIUM: Magnesium: 1.9 mg/dL (ref 1.7–2.4)

## 2020-12-31 LAB — MRSA PCR SCREENING: MRSA by PCR: NEGATIVE

## 2020-12-31 MED ORDER — PIPERACILLIN-TAZOBACTAM 3.375 G IVPB
3.3750 g | Freq: Three times a day (TID) | INTRAVENOUS | Status: DC
  Administered 2020-12-31 – 2021-01-01 (×4): 3.375 g via INTRAVENOUS
  Filled 2020-12-31 (×6): qty 50

## 2020-12-31 MED ORDER — SODIUM CHLORIDE 0.9% FLUSH
3.0000 mL | Freq: Two times a day (BID) | INTRAVENOUS | Status: DC
  Administered 2020-12-31 – 2021-01-02 (×4): 3 mL via INTRAVENOUS

## 2020-12-31 MED ORDER — IOHEXOL 300 MG/ML  SOLN
100.0000 mL | Freq: Once | INTRAMUSCULAR | Status: AC | PRN
  Administered 2020-12-31: 100 mL via INTRAVENOUS

## 2020-12-31 MED ORDER — PIPERACILLIN-TAZOBACTAM 3.375 G IVPB 30 MIN
3.3750 g | Freq: Once | INTRAVENOUS | Status: AC
  Administered 2020-12-31: 3.375 g via INTRAVENOUS
  Filled 2020-12-31: qty 50

## 2020-12-31 MED ORDER — SODIUM CHLORIDE 0.9 % IV BOLUS
1000.0000 mL | Freq: Once | INTRAVENOUS | Status: AC
Start: 2020-12-31 — End: 2020-12-31
  Administered 2020-12-31: 1000 mL via INTRAVENOUS

## 2020-12-31 MED ORDER — ONDANSETRON HCL 4 MG/2ML IJ SOLN
4.0000 mg | Freq: Once | INTRAMUSCULAR | Status: DC
  Filled 2020-12-31: qty 2

## 2020-12-31 MED ORDER — HEPARIN SODIUM (PORCINE) 5000 UNIT/ML IJ SOLN
5000.0000 [IU] | Freq: Three times a day (TID) | INTRAMUSCULAR | Status: DC
  Administered 2020-12-31 – 2021-01-01 (×5): 5000 [IU] via SUBCUTANEOUS
  Filled 2020-12-31 (×5): qty 1

## 2020-12-31 MED ORDER — LACTATED RINGERS IV BOLUS (SEPSIS)
1000.0000 mL | Freq: Once | INTRAVENOUS | Status: AC
  Administered 2020-12-31: 1000 mL via INTRAVENOUS

## 2020-12-31 MED ORDER — MORPHINE SULFATE (PF) 2 MG/ML IV SOLN
2.0000 mg | Freq: Once | INTRAVENOUS | Status: DC
  Filled 2020-12-31: qty 1

## 2020-12-31 MED ORDER — IPRATROPIUM-ALBUTEROL 0.5-2.5 (3) MG/3ML IN SOLN
3.0000 mL | Freq: Once | RESPIRATORY_TRACT | Status: AC
  Administered 2020-12-31: 3 mL via RESPIRATORY_TRACT
  Filled 2020-12-31: qty 3

## 2020-12-31 MED ORDER — MAGNESIUM SULFATE 2 GM/50ML IV SOLN
2.0000 g | Freq: Once | INTRAVENOUS | Status: AC
  Administered 2020-12-31: 2 g via INTRAVENOUS
  Filled 2020-12-31: qty 50

## 2020-12-31 MED ORDER — ALBUTEROL SULFATE (2.5 MG/3ML) 0.083% IN NEBU: 2.5000 mg | INHALATION_SOLUTION | RESPIRATORY_TRACT | Status: DC | PRN

## 2020-12-31 MED ORDER — ACETAMINOPHEN 500 MG PO TABS
1000.0000 mg | ORAL_TABLET | Freq: Once | ORAL | Status: AC
  Administered 2021-01-01: 1000 mg via ORAL
  Filled 2020-12-31: qty 2

## 2020-12-31 MED ORDER — FUROSEMIDE 10 MG/ML IJ SOLN
40.0000 mg | Freq: Once | INTRAMUSCULAR | Status: AC
  Administered 2020-12-31: 40 mg via INTRAVENOUS
  Filled 2020-12-31: qty 4

## 2020-12-31 NOTE — ED Provider Notes (Signed)
[facility]-EMERGENCY DEPT Provider Note   CSN: [id number] Arrival date & time: 12/31/20  0047     History Chief Complaint  Patient presents with  . Abdominal Pain    Pamela Moody is a 87 y.o. female.  87 yo F with a chief complaints of right-sided abdominal pain.  Going on for a few days now.  Worse especially when she tries to move worse when she gets up to stand or ambulate.  Radiates down to the right groin.  Sometimes goes into the right leg.  She denies any pain in the back.  Denies trauma.  Denies nausea or vomiting denies fevers.  Has had increased urinary frequency.  Denies dysuria.  Denies constipation or diarrhea.  She denies any cough or congestion.  Denies shortness of breath.  The history is provided by the patient.  Abdominal Pain Pain location:  RLQ and RUQ Pain quality: sharp and shooting   Pain radiates to:  R leg Pain severity:  Severe Onset quality:  Gradual Duration:  3 days Timing:  Constant Progression:  Worsening Chronicity:  New Relieved by:  Nothing Worsened by:  Nothing Ineffective treatments:  None tried Associated symptoms: no chest pain, no chills, no dysuria, no fever, no nausea, no shortness of breath and no vomiting        Past Medical History:  Diagnosis Date  . Atrial fibrillation (HCC)   . Hypertension     There are no problems to display for this patient.   Past Surgical History:  Procedure Laterality Date  . ABDOMINAL HYSTERECTOMY    . TOTAL KNEE ARTHROPLASTY     bilat.   Marland Kitchen. VASCULAR SURGERY     vericous vein surgery     OB History   No obstetric history on file.     No family history on file.  Social History   Tobacco Use  . Smoking status: Never Smoker  Substance Use Topics  . Alcohol use: No  . Drug use: No    Home Medications Prior to Admission medications   Not on File    Allergies    Sulfa antibiotics  Review of Systems   Review of Systems  Constitutional: Negative  for chills and fever.  HENT: Negative for congestion and rhinorrhea.   Eyes: Negative for redness and visual disturbance.  Respiratory: Negative for shortness of breath and wheezing.   Cardiovascular: Negative for chest pain and palpitations.  Gastrointestinal: Positive for abdominal pain. Negative for nausea and vomiting.  Genitourinary: Negative for dysuria and urgency.  Musculoskeletal: Negative for arthralgias and myalgias.  Skin: Negative for pallor and wound.  Neurological: Negative for dizziness and headaches.    Physical Exam Updated Vital Signs BP (!) 149/116   Pulse 76   Temp (!) 97.5 F (36.4 C) (Oral)   Resp (!) 21   Ht 5\' 1"  (1.549 m)   Wt 97 kg   SpO2 95%   BMI 40.41 kg/m   Physical Exam Vitals and nursing note reviewed.  Constitutional:      General: She is not in acute distress.    Appearance: She is well-developed. She is not diaphoretic.  HENT:     Head: Normocephalic and atraumatic.  Eyes:     Pupils: Pupils are equal, round, and reactive to light.  Cardiovascular:     Rate and Rhythm: Normal rate and regular rhythm.     Heart sounds: No murmur heard. No friction rub. No gallop.   Pulmonary:  Effort: Pulmonary effort is normal.     Breath sounds: No wheezing or rales.     Comments: Tachypnea Abdominal:     General: There is no distension.     Palpations: Abdomen is soft.     Tenderness: There is abdominal tenderness in the right upper quadrant and right lower quadrant.     Comments: Pain diffusely about the abdomen but worse to the right lower quadrant.  Positive rebound.  Musculoskeletal:        General: No tenderness.     Cervical back: Normal range of motion and neck supple.  Skin:    General: Skin is warm and dry.  Neurological:     Mental Status: She is alert and oriented to person, place, and time.  Psychiatric:        Behavior: Behavior normal.     ED Results / Procedures / Treatments   Labs (all labs ordered are listed, but  only abnormal results are displayed) Labs Reviewed  CBC WITH DIFFERENTIAL/PLATELET - Abnormal; Notable for the following components:      Result Value   WBC 13.6 (*)    Hemoglobin 15.5 (*)    HCT 48.2 (*)    Neutro Abs 11.0 (*)    Monocytes Absolute 1.3 (*)    All other components within normal limits  COMPREHENSIVE METABOLIC PANEL - Abnormal; Notable for the following components:   Glucose, Bld 120 (*)    Total Bilirubin 2.2 (*)    GFR, Estimated 55 (*)    All other components within normal limits  URINALYSIS, ROUTINE W REFLEX MICROSCOPIC - Abnormal; Notable for the following components:   Color, Urine STRAW (*)    Specific Gravity, Urine 1.004 (*)    All other components within normal limits  RESP PANEL BY RT-PCR (FLU A&B, COVID) ARPGX2  LIPASE, BLOOD  LACTIC ACID, PLASMA  LACTIC ACID, PLASMA  MAGNESIUM  I-STAT CHEM 8, ED  I-STAT BETA HCG BLOOD, ED ([facility], [facility], [facility] ONLY)    EKG EKG Interpretation  Date/Time:  Thursday December 31 2020 06:25:01 EDT Ventricular Rate:  102 PR Interval:    QRS Duration: 136 QT Interval:  359 QTC Calculation: 468 R Axis:   -3 Text Interpretation: Atrial fibrillation Left bundle branch block No significant change since last tracing Confirmed by Melene Plan [phone]) on 12/31/2020 6:49:53 AM   Radiology CT ABDOMEN PELVIS W CONTRAST  Result Date: 12/31/2020 CLINICAL DATA:  Right lower quadrant pain. EXAM: CT ABDOMEN AND PELVIS WITH CONTRAST TECHNIQUE: Multidetector CT imaging of the abdomen and pelvis was performed using the standard protocol following bolus administration of intravenous contrast. CONTRAST:  OMNIPAQUE IOHEXOL 300 MG/ML  SOLN COMPARISON:  None. FINDINGS: Lower chest:  No acute finding.  Aortic atherosclerosis. Hepatobiliary: Exophytic cysts from the inferior right liver measuring up to 2 cm where there is minimal peripheral calcification seen on coronal reformats.No evidence of biliary obstruction or stone. Pancreas: Generalized  atrophy Spleen: Unremarkable. Adrenals/Urinary Tract: Negative adrenals. No hydronephrosis or stone. Unremarkable bladder. Stomach/Bowel: Thick walled appendix with mesoappendiceal fat stranding and 11 mm outer wall diameter. A tiny appendicolith could be present towards the base on reformats. Subtle lobulation and possible wall thinning present at the tip, but without abscess or extraluminal gas. The appendix is in expected location in the right lower quadrant. No bowel obstruction/ileus. Vascular/Lymphatic: Diffuse atheromatous changes, expected for age. No mass or adenopathy. Reproductive:Hysterectomy Other: No ascites or pneumoperitoneum. Musculoskeletal: Lumbar spine degeneration with exaggerated lumbar lordosis. T10 hemangioma. IMPRESSION:  Acute suppurative appendicitis. There may be wall thinning towards the tip but no discrete perforation or abscess. Electronically Signed   By: Marnee Spring M.D.   On: 12/31/2020 05:19   DG Chest Port 1 View  Result Date: 12/31/2020 CLINICAL DATA:  Cough, shortness of breath EXAM: PORTABLE CHEST 1 VIEW COMPARISON:  06/25/2010 FINDINGS: Some coarse patchy and interstitial opacity seen in the mid to lower lungs. Low volumes and atelectatic changes. Enlarged cardiac silhouette though may be partially accentuated by portable technique. Calcified tortuous aorta is noted as well. No visible pneumothorax or layering effusion. Telemetry leads overlie the chest. No acute osseous abnormality or suspicious osseous lesion. Left greater than right degenerative changes in the shoulders. IMPRESSION: Coarse patchy and interstitial opacity in the mid to lower lungs, could reflect atelectasis, edema or infection. Enlarged cardiac silhouette, possibly accentuated by portable technique. Aortic Atherosclerosis (ICD10-I70.0). Electronically Signed   By: Kreg Shropshire M.D.   On: 12/31/2020 01:29    Procedures Procedures  Procedure note: Ultrasound Guided Peripheral IV Ultrasound guided  peripheral 1.88 inch angiocath IV placement performed by me. Indications: Nursing unable to place IV. Details: The antecubital fossa and upper arm were evaluated with a multifrequency linear probe. Patent brachial veins were noted. 1 attempt was made to cannulate a vein under realtime US guidance with successful cannulation of the vein and catheter placement. There is return of non-pulsatile dark red blood. The patient tolerated the procedure well without complications. Images archived electronically.  CPT codes: 56812 and [phone]  Medications Ordered in ED Medications  morphine 2 MG/ML injection 2 mg (2 mg Intravenous Not Given 12/31/20 0246)  ondansetron (ZOFRAN) injection 4 mg (4 mg Intravenous Not Given 12/31/20 0246)  acetaminophen (TYLENOL) tablet 1,000 mg (1,000 mg Oral Not Given 12/31/20 0311)  sodium chloride 0.9 % bolus 1,000 mL (0 mLs Intravenous Stopped 12/31/20 0421)  iohexol (OMNIPAQUE) 300 MG/ML solution 100 mL (100 mLs Intravenous Contrast Given 12/31/20 0457)  magnesium sulfate IVPB 2 g 50 mL (0 g Intravenous Stopped 12/31/20 0659)  piperacillin-tazobactam (ZOSYN) IVPB 3.375 g (0 g Intravenous Stopped 12/31/20 0174)    ED Course  I have reviewed the triage vital signs and the nursing notes.  Pertinent labs & imaging results that were available during my care of the patient were reviewed by me and considered in my medical decision making (see chart for details).    MDM Rules/Calculators/A&P                          87 yo F with a chief complaints of right-sided abdominal pain going on for a few days now.  Worse with movement no systemic symptoms.  Patient is significantly tachypneic upon arrival and was requiring 2 L of oxygen with EMS.  She denies any difficultly with breathing and denies any coughing but EMS that she was coughing quite a bit on the way here.  Will obtain a chest x-ray blood work CT scan of the abdomen pelvis Covid test.  Leukocytosis.  Covid negative.  No  significant LFT elevation lipase is normal.  CT scan of the abdomen pelvis concerning for acute appendicitis.  Will start on antibiotics.  Will discuss with general surgery.   Discussed with Tresa Endo from general surgery, will come and eval patient.  Discussed with medicine for admission.   The patients results and plan were reviewed and discussed.   Any x-rays performed were independently reviewed by myself.   Differential diagnosis were  considered with the presenting HPI.  Medications  morphine 2 MG/ML injection 2 mg (2 mg Intravenous Not Given 12/31/20 0246)  ondansetron (ZOFRAN) injection 4 mg (4 mg Intravenous Not Given 12/31/20 0246)  acetaminophen (TYLENOL) tablet 1,000 mg (1,000 mg Oral Not Given 12/31/20 0311)  sodium chloride 0.9 % bolus 1,000 mL (0 mLs Intravenous Stopped 12/31/20 0421)  iohexol (OMNIPAQUE) 300 MG/ML solution 100 mL (100 mLs Intravenous Contrast Given 12/31/20 0457)  magnesium sulfate IVPB 2 g 50 mL (0 g Intravenous Stopped 12/31/20 0659)  piperacillin-tazobactam (ZOSYN) IVPB 3.375 g (0 g Intravenous Stopped 12/31/20 0659)    Vitals:   12/31/20 0626 12/31/20 0628 12/31/20 0630 12/31/20 0703  BP: (!) 149/116     Pulse: (!) 105  76   Resp: 16  (!) 21   Temp:      TempSrc:      SpO2: 93% 93% 95%   Weight:    97 kg  Height:    5\' 1"  (1.549 m)    Final diagnoses:  Acute appendicitis with localized peritonitis, without perforation, abscess, or gangrene    Admission/ observation were discussed with the admitting physician, patient and/or family and they are comfortable with the plan.    Final Clinical Impression(s) / ED Diagnoses Final diagnoses:  Acute appendicitis with localized peritonitis, without perforation, abscess, or gangrene    Rx / DC Orders ED Discharge Orders    None       , DO 12/31/20 [id number]

## 2020-12-31 NOTE — Consult Note (Signed)
Cardiology Consultation:   Patient ID: KAIDENCE SANT MRN: [id number]; DOB: 1922-07-20  Admit date: 12/31/2020 Date of Consult: 12/31/2020  PCP:  System, Provider Not In   [facility]  Cardiologist:  No primary care provider on file.  Dr. Vernona Rieger Advanced Practice Provider:  No care team member to display Electrophysiologist:  None        Patient Profile:   NAYAH LUKENS is a 87 y.o. female with a hx of permanent atrial fibrillation who is being seen today for the evaluation of heart failure/EF 40 to 45% at the request of Dr. Henrene Hawking.  History of Present Illness:   Ms. Sokol is a 87 year old female with permanent atrial fibrillation on Eliquis with left bundle branch block, cardiomyopathy here with appendicitis from [facility] nursing facility.  Had abdominal pain for 3 days.  Her oxygen saturation was 88% by EMS.  2 L were placed.  An echocardiogram performed during this hospitalization demonstrated an EF of 40 to 45%.  I do not have an old echocardiogram to compare however cardiomyopathy was noted in her prior history.  CT of the abdomen showed acute suppurative appendicitis with no discrete perforation or abscess.  Zosyn.  General surgery consulted.  Blood pressure 151/100 on admit.  Heart rate 128.  Currently heart rate 76 bpm and atrial fibrillation with blood pressure 119/69.  Currently when asked how her breathing is, she says it is fine.  She states she does not understand why people keep asking her about her breathing.  This has been her norm for quite some time she states.  Interestingly, her sister had appendicitis at age 39 she remembers her playing and then collapsing to the ground and had a gangrenous appendix.  When told about her surgical risk, she does state that limits her time she is fine to go.  She is at peace.  Currently DNR.   Past Medical History:  Diagnosis Date  . Atrial fibrillation (HCC)   . Hypertension    . OSA (obstructive sleep apnea)    non-compliant, gave away her CPAP machine    Past Surgical History:  Procedure Laterality Date  . ABDOMINAL HYSTERECTOMY    . JOINT REPLACEMENT    . TOTAL KNEE ARTHROPLASTY     bilat.   Marland Kitchen VASCULAR SURGERY     vericous vein surgery     Home Medications:  Prior to Admission medications   Medication Sig Start Date End Date Taking? Authorizing Provider  acetaminophen (TYLENOL) 500 MG tablet Take 500 mg by mouth every 6 (six) hours as needed for mild pain, fever or headache.   Yes [provider]  cholecalciferol (VITAMIN D3) 25 MCG (1000 UNIT) tablet Take 1,000 Units by mouth daily.   Yes [provider]  ELIQUIS 5 MG TABS tablet Take 5 mg by mouth 2 (two) times daily. 12/28/20  Yes [provider]  Multiple Vitamins-Minerals (PRESERVISION/LUTEIN) CAPS Take 1 tablet by mouth 2 (two) times daily. 07/19/13  Yes [provider]  Omega-3 1000 MG CAPS Take 1 g by mouth daily.   Yes [provider]  polyvinyl alcohol (LIQUIFILM TEARS) 1.4 % ophthalmic solution Place 1 drop into both eyes as needed for dry eyes.   Yes [provider]  spironolactone (ALDACTONE) 25 MG tablet Take 25 mg by mouth daily. 12/28/20  Yes [provider]  VITAMIN E PO Take 1 tablet by mouth daily.   Yes [provider]    Inpatient Medications:  Scheduled Meds: . acetaminophen  1,000 mg Oral Once  . heparin  5,000 Units Subcutaneous Q8H  .  morphine injection  2 mg Intravenous Once  . ondansetron (ZOFRAN) IV  4 mg Intravenous Once  . sodium chloride flush  3 mL Intravenous Q12H   Continuous Infusions: . piperacillin-tazobactam (ZOSYN)  IV 3.375 g (12/31/20 1128)   PRN Meds: albuterol  Allergies:    Allergies  Allergen Reactions  . Diltiazem     Other reaction(s): Other (See Comments) Unknown  Unknown    . Sulfa Antibiotics     Pt states causes swelling  . Morphine Nausea And Vomiting    Social  History:   Social History   Socioeconomic History  . Marital status: Widowed    Spouse name: Not on file  . Number of children: Not on file  . Years of education: Not on file  . Highest education level: Not on file  Occupational History  . Not on file  Tobacco Use  . Smoking status: Never Smoker  . Smokeless tobacco: Not on file  Substance and Sexual Activity  . Alcohol use: No  . Drug use: No  . Sexual activity: Not Currently  Other Topics Concern  . Not on file  Social History Narrative  . Not on file   Social Determinants of Health   Financial Resource Strain: Not on file  Food Insecurity: Not on file  Transportation Needs: Not on file  Physical Activity: Not on file  Stress: Not on file  Social Connections: Not on file  Intimate Partner Violence: Not on file    Family History:   History reviewed. No pertinent family history.  No early family history of CAD ROS:  Please see the history of present illness.   All other ROS reviewed and negative.     Physical Exam/Data:   Vitals:   12/31/20 1000 12/31/20 1121 12/31/20 1332 12/31/20 1610  BP: 140/61 (!) 144/97 126/68 (!) 135/96  Pulse: 76 87 81 85  Resp:  Temp:    98 F (36.7 C)  TempSrc:    Oral  SpO2: 93% 94% 100% 93%  Weight:      Height:        Intake/Output Summary (Last 24 hours) at 12/31/2020 1744 Last data filed at 12/31/2020 1048 Gross per 24 hour  Intake 300 ml  Output --  Net 300 ml   Last 3 Weights 12/31/2020 08/01/2012  Weight (lbs) 213 lb 13.5 oz 213 lb  Weight (kg) 97 kg 96.616 kg     Body mass index is 40.41 kg/m.  General: Elderly, alert HEENT: normal Lymph: no adenopathy Neck: no JVD Endocrine:  No thryomegaly Vascular: No carotid bruits; FA pulses 2+ bilaterally without bruits  Cardiac:  normal S1, S2; Irreg irreg normal rate; no murmur  Lungs:  clear to auscultation bilaterally, no wheezing, rhonchi or rales  Abd: soft, nontender, no hepatomegaly  Ext: no  edema Musculoskeletal:  No deformities, BUE and BLE strength normal and equal Skin: warm and dry  Neuro:  CNs 2-12 intact, no focal abnormalities noted Psych:  Normal affect   EKG:  The EKG was personally reviewed and demonstrates: Atrial fibrillation heart rate 102 left bundle branch block  Telemetry:  Telemetry was personally reviewed and demonstrates:  Now afib 80's  Relevant CV Studies:  ECHO this admit:  1. Left ventricular ejection fraction, by estimation, is 40 to 45%. The  left ventricle has mildly decreased function. The left  ventricle  demonstrates global hypokinesis. There is moderate concentric left  ventricular hypertrophy. Left ventricular  diastolic function could not be evaluated.  2. Right ventricular systolic function is normal. The right ventricular  size is normal. There is normal pulmonary artery systolic pressure. The  estimated right ventricular systolic pressure is 33.9 mmHg.  3. Left atrial size was moderately dilated.  4. Right atrial size was mildly dilated.  5. The mitral valve is normal in structure. No evidence of mitral valve  regurgitation. No evidence of mitral stenosis.  6. The aortic valve is calcified. There is mild calcification of the  aortic valve. There is mild thickening of the aortic valve. Aortic valve  regurgitation is not visualized. No aortic stenosis is present.  7. The inferior vena cava is normal in size with greater than 50%  respiratory variability, suggesting right atrial pressure of 3 mmHg.   Laboratory Data:  High Sensitivity Troponin:  No results for input(s): TROPONINIHS in the last 720 hours.   Chemistry Recent Labs  Lab 12/31/20 0300  NA 137  K 4.8  CL 103  CO2 25  GLUCOSE 120*  BUN 16  CREATININE 0.95  CALCIUM 9.1  GFRNONAA 55*  ANIONGAP 9    Recent Labs  Lab 12/31/20 0300  PROT 7.6  ALBUMIN 4.1  AST 26  ALT 9  ALKPHOS 56  BILITOT 2.2*   Hematology Recent Labs  Lab 12/31/20 0300  WBC  13.6*  RBC 5.01  HGB 15.5*  HCT 48.2*  MCV 96.2  MCH 30.9  MCHC 32.2  RDW 14.0  PLT 225   BNPNo results for input(s): BNP, PROBNP in the last 168 hours.  DDimer No results for input(s): DDIMER in the last 168 hours.   Radiology/Studies:  CT ABDOMEN PELVIS W CONTRAST  Result Date: 12/31/2020 CLINICAL DATA:  Right lower quadrant pain. EXAM: CT ABDOMEN AND PELVIS WITH CONTRAST TECHNIQUE: Multidetector CT imaging of the abdomen and pelvis was performed using the standard protocol following bolus administration of intravenous contrast. CONTRAST:  OMNIPAQUE IOHEXOL 300 MG/ML  SOLN COMPARISON:  None. FINDINGS: Lower chest:  No acute finding.  Aortic atherosclerosis. Hepatobiliary: Exophytic cysts from the inferior right liver measuring up to 2 cm where there is minimal peripheral calcification seen on coronal reformats.No evidence of biliary obstruction or stone. Pancreas: Generalized atrophy Spleen: Unremarkable. Adrenals/Urinary Tract: Negative adrenals. No hydronephrosis or stone. Unremarkable bladder. Stomach/Bowel: Thick walled appendix with mesoappendiceal fat stranding and 11 mm outer wall diameter. A tiny appendicolith could be present towards the base on reformats. Subtle lobulation and possible wall thinning present at the tip, but without abscess or extraluminal gas. The appendix is in expected location in the right lower quadrant. No bowel obstruction/ileus. Vascular/Lymphatic: Diffuse atheromatous changes, expected for age. No mass or adenopathy. Reproductive:Hysterectomy Other: No ascites or pneumoperitoneum. Musculoskeletal: Lumbar spine degeneration with exaggerated lumbar lordosis. T10 hemangioma. IMPRESSION: Acute suppurative appendicitis. There may be wall thinning towards the tip but no discrete perforation or abscess. Electronically Signed   By: Marnee Spring M.D.   On: 12/31/2020 05:19   DG Chest Port 1 View  Result Date: 12/31/2020 CLINICAL DATA:  Cough, shortness of  breath EXAM: PORTABLE CHEST 1 VIEW COMPARISON:  06/25/2010 FINDINGS: Some coarse patchy and interstitial opacity seen in the mid to lower lungs. Low volumes and atelectatic changes. Enlarged cardiac silhouette though may be partially accentuated by portable technique. Calcified tortuous aorta is noted as well. No visible pneumothorax or layering effusion. Telemetry leads overlie the  chest. No acute osseous abnormality or suspicious osseous lesion. Left greater than right degenerative changes in the shoulders. IMPRESSION: Coarse patchy and interstitial opacity in the mid to lower lungs, could reflect atelectasis, edema or infection. Enlarged cardiac silhouette, possibly accentuated by portable technique. Aortic Atherosclerosis (ICD10-I70.0). Electronically Signed   By: Kreg Shropshire M.D.   On: 12/31/2020 01:29   ECHOCARDIOGRAM COMPLETE  Result Date: 12/31/2020    ECHOCARDIOGRAM REPORT   Patient Name:   KIAN GAMARRA Date of Exam: 12/31/2020 Medical Rec #:  [id number]          Height:       61.0 in Accession #:    [id number]         Weight:       213.8 lb Date of Birth:  03/24/1922           BSA:          1.943 m Patient Age:    89 years           BP:           126/68 mmHg Patient Gender: F                  HR:           81 bpm. Exam Location:  Inpatient Procedure: 2D Echo, Cardiac Doppler and Color Doppler Indications:    Dyspnea  History:        Patient has no prior history of Echocardiogram examinations.                 Cardiomegaly, Arrythmias:Atrial Fibrillation and LBBB,                 Signs/Symptoms:Abdominal pain, appendicitis and Dyspnea; Risk                 Factors:Hypertension and obesity.  Sonographer:    Lavenia Atlas Referring Phys: [id number] JARED E SEGAL  Sonographer Comments: Patient is morbidly obese. IMPRESSIONS  1. Left ventricular ejection fraction, by estimation, is 40 to 45%. The left ventricle has mildly decreased function. The left ventricle demonstrates global hypokinesis. There is  moderate concentric left ventricular hypertrophy. Left ventricular diastolic function could not be evaluated.  2. Right ventricular systolic function is normal. The right ventricular size is normal. There is normal pulmonary artery systolic pressure. The estimated right ventricular systolic pressure is 33.9 mmHg.  3. Left atrial size was moderately dilated.  4. Right atrial size was mildly dilated.  5. The mitral valve is normal in structure. No evidence of mitral valve regurgitation. No evidence of mitral stenosis.  6. The aortic valve is calcified. There is mild calcification of the aortic valve. There is mild thickening of the aortic valve. Aortic valve regurgitation is not visualized. No aortic stenosis is present.  7. The inferior vena cava is normal in size with greater than 50% respiratory variability, suggesting right atrial pressure of 3 mmHg. FINDINGS  Left Ventricle: Left ventricular ejection fraction, by estimation, is 40 to 45%. The left ventricle has mildly decreased function. The left ventricle demonstrates global hypokinesis. The left ventricular internal cavity size was normal in size. There is  moderate concentric left ventricular hypertrophy. Abnormal (paradoxical) septal motion, consistent with left bundle branch block. Left ventricular diastolic function could not be evaluated due to atrial fibrillation. Left ventricular diastolic function could not be evaluated. Right Ventricle: The right ventricular size is normal. No increase in right ventricular wall thickness. Right ventricular systolic function is normal.  There is normal pulmonary artery systolic pressure. The tricuspid regurgitant velocity is 2.78 m/s, and  with an assumed right atrial pressure of 3 mmHg, the estimated right ventricular systolic pressure is 33.9 mmHg. Left Atrium: Left atrial size was moderately dilated. Right Atrium: Right atrial size was mildly dilated. Pericardium: There is no evidence of pericardial effusion. Mitral  Valve: The mitral valve is normal in structure. No evidence of mitral valve regurgitation. No evidence of mitral valve stenosis. Tricuspid Valve: The tricuspid valve is normal in structure. Tricuspid valve regurgitation is mild . No evidence of tricuspid stenosis. Aortic Valve: The aortic valve is calcified. There is mild calcification of the aortic valve. There is mild thickening of the aortic valve. Aortic valve regurgitation is not visualized. No aortic stenosis is present. Pulmonic Valve: The pulmonic valve was normal in structure. Pulmonic valve regurgitation is not visualized. No evidence of pulmonic stenosis. Aorta: The aortic root is normal in size and structure. Venous: The inferior vena cava is normal in size with greater than 50% respiratory variability, suggesting right atrial pressure of 3 mmHg. IAS/Shunts: No atrial level shunt detected by color flow Doppler.  LEFT VENTRICLE PLAX 2D LVIDd:         4.55 cm  Diastology LVIDs:         3.93 cm  LV e' medial:    4.68 cm/s LV PW:         1.30 cm  LV E/e' medial:  18.7 LV IVS:        1.36 cm  LV e' lateral:   3.81 cm/s LVOT diam:     1.90 cm  LV E/e' lateral: 22.9 LV SV:         39 LV SV Index:   20 LVOT Area:     2.84 cm  RIGHT VENTRICLE RV Basal diam:  3.44 cm RV S prime:     10.60 cm/s TAPSE (M-mode): 3.6 cm LEFT ATRIUM         Index LA diam:    5.00 cm 2.57 cm/m  AORTIC VALVE LVOT Vmax:   78.30 cm/s LVOT Vmean:  58.600 cm/s LVOT VTI:    0.137 m  AORTA Ao Root diam: 3.10 cm MITRAL VALVE               TRICUSPID VALVE MV Area (PHT): 4.31 cm    TR Peak grad:   30.9 mmHg MV Decel Time: 176 msec    TR Vmax:        278.00 cm/s MV E velocity: 87.30 cm/s MV A velocity: 23.80 cm/s  SHUNTS MV E/A ratio:  3.67        Systemic VTI:  0.14 m                            Systemic Diam: 1.90 cm Tobias AlexanderKatarina Nelson MD Electronically signed by Tobias AlexanderKatarina Nelson MD Signature Date/Time: 12/31/2020/3:52:50 PM    Final      Assessment and Plan:   Acute appendicitis -Per  primary team, on IV Zosyn.  General surgery consult reviewed.  May be treated medically.  Of course, she would be high risk for any procedure mainly because of advanced age.  Currently her atrial fibrillation is under good control.  Chronic systolic heart failure -Has a known cardiomyopathy, left bundle branch block.  May have had this EF chronically.  EF now 40 to 45%. -Agree with dose of IV Lasix.  Be careful in the setting  of infection.  Currently she is laying flat, comfortable on room air.  Looks like she does not need any further IV diuretics. Appears well compensated. -She has not seen her primary cardiologist Dr. Judithe Modest in quite some time she states.  Been doing well.  Permanent atrial fibrillation -Agree with holding Eliquis if surgical intervention is necessary. -Currently at times mildly tachycardic however reasonable given her underlying infection. -If necessary, can utilize metoprolol for rate control.   For questions or updates, please contact [facility] Please consult [email or web address] for contact info under    Signed, Donato Schultz, MD  12/31/2020 5:44 PM

## 2020-12-31 NOTE — Progress Notes (Signed)
Pharmacy Antibiotic Note  Pamela Moody is a 87 y.o. female admitted on 12/31/2020 with IAI.  Pharmacy has been consulted for Zosyn dosing.  Plan: Zosyn 3.375g IV q8h (4 hour infusion).  Height: 5\' 1"  (154.9 cm) Weight: 97 kg (213 lb 13.5 oz) IBW/kg (Calculated) : 47.8  Temp (24hrs), Avg:97.5 F (36.4 C), Min:97.5 F (36.4 C), Max:97.5 F (36.4 C)  Recent Labs  Lab 12/31/20 0300 12/31/20 0558  WBC 13.6*  --   CREATININE 0.95  --   LATICACIDVEN 1.5 0.8    Estimated Creatinine Clearance: 36.9 mL/min (by C-G formula based on SCr of 0.95 mg/dL).    Allergies  Allergen Reactions  . Diltiazem     Other reaction(s): Other (See Comments) Unknown  Unknown    . Sulfa Antibiotics     Pt states causes swelling  . Morphine Nausea And Vomiting     Dosage will likely  remain stable at above dose and need for further dosage adjustment appears unlikely at present.    Will sign off at this time.  Please reconsult if a change in clinical status warrants re-evaluation of dosage.     Thank you for allowing pharmacy to be a part of this patient's care.   01/02/21, PharmD, BCPS 12/31/2020 9:34 AM

## 2020-12-31 NOTE — ED Notes (Signed)
Pt with occasional runs of Vtach showing on monitor.  Pt a&ox4, vitals stable, no acute distress noted.  Dr.Floyd aware, orders placed.

## 2020-12-31 NOTE — ED Notes (Deleted)
Spoke to Child psychotherapist in regards to pt wanting her to speak to her daughter.

## 2020-12-31 NOTE — Progress Notes (Signed)
  Echocardiogram 2D Echocardiogram has been performed.  Pieter Partridge 12/31/2020, 2:39 PM

## 2020-12-31 NOTE — H&P (Addendum)
History and Physical        Hospital Admission Note Date: 12/31/2020  Patient name: Pamela Moody Medical record number: [id number] Date of birth: 06/04/1922 Age: 87 y.o. Gender: female  PCP: System, Provider Not In  Patient coming from: [facility]  Chief Complaint    Chief Complaint  Patient presents with  . Abdominal Pain      HPI:   This is a very pleasant 87 year old female with a history of permanent atrial fibrillation on Eliquis, hypertension, diverticulitis, fall risk, LBBB, cardiomyopathy who presented to the ED with worsening RLQ and RUQ abdominal pain x 3 days. Pain was gradual in onset and constant without any relieving factors. Worse when moving her right leg when trying to get in bed. Patient was concerned for for appendicitis which prompted her to come to the ED. Most recent Eliquis dose was last night.  Per EMS, patient had an SpO2 88% on room air and was placed on 2 L/min. No nausea, vomiting, fever, shortness of breath, cough.  States that she has had chronic lower extremity edema most notably in her right leg after having multiple varicose vein surgeries in that leg.    ED Course: Afebrile, tachycardic, tachypneic,  SpO2 90% with improvement to 95% on 2->4L/min. Notable Labs: Na 137, K 4.8, Glucose 120, BUN 16, Cr 0.95, Lipase 28, AST 26, ALT 9, T bili 2.2, Lactic acid 1.5->0.8, WBC 13.6, Hb 15.5, COVID 19 negative. Notable Imaging: CXR - coarse patchy and intersitial opacity in mid to lower lungs could reflect atelectasis, edema or infection. CT abd/pelv w contrast - acute suppurative appendicitis with no discrete perforation or abscess. Patient received Zosyn, 1 L NS bolus, magnesium. General surgery was consulted by the ED provider.    Vitals:   12/31/20 0700 12/31/20 0800  BP: (!) 151/100 (!) 144/108  Pulse: (!) 119 (!) 128  Resp: 20    Temp:    SpO2: 92% 92%     Review of Systems:  Review of Systems  All other systems reviewed and are negative.   Medical/Social/Family History   Past Medical History: Past Medical History:  Diagnosis Date  . Atrial fibrillation (HCC)   . Hypertension     Past Surgical History:  Procedure Laterality Date  . ABDOMINAL HYSTERECTOMY    . TOTAL KNEE ARTHROPLASTY     bilat.   Marland Kitchen. VASCULAR SURGERY     vericous vein surgery    Medications: Prior to Admission medications   Not on File    Allergies:   Allergies  Allergen Reactions  . Sulfa Antibiotics     Pt states causes swelling    Social History:  reports that she has never smoked. She does not have any smokeless tobacco history on file. She reports that she does not drink alcohol and does not use drugs.  Family History: No family history on file.   Objective   Physical Exam: Blood pressure (!) 144/108, pulse (!) 128, temperature (!) 97.5 F (36.4 C), temperature source Oral, resp. rate 20, height 5\' 1"  (1.549 m), weight 97 kg, SpO2 92 %.  Physical Exam Vitals and nursing note reviewed.  Constitutional:      Appearance:  Normal appearance.  HENT:     Head: Normocephalic and atraumatic.  Eyes:     Conjunctiva/sclera: Conjunctivae normal.  Cardiovascular:     Rate and Rhythm: Normal rate. Rhythm irregular.     Heart sounds: No murmur heard.   Pulmonary:     Breath sounds: Wheezing present.     Comments: SpO2 decreased from 96% to 90% from 4 L/min-> room air Abdominal:     General: Abdomen is flat.     Palpations: Abdomen is soft.     Tenderness: There is abdominal tenderness in the right lower quadrant.  Musculoskeletal:     Comments: 1+ RLE pitting edema  Skin:    Coloration: Skin is not jaundiced or pale.  Neurological:     Mental Status: She is alert. Mental status is at baseline.  Psychiatric:        Mood and Affect: Mood normal.        Behavior: Behavior normal.     LABS on Admission: I  have personally reviewed all the labs and imaging below    Basic Metabolic Panel: Recent Labs  Lab 12/31/20 0300 12/31/20 0558  NA 137  --   K 4.8  --   CL 103  --   CO2 25  --   GLUCOSE 120*  --   BUN 16  --   CREATININE 0.95  --   CALCIUM 9.1  --   MG  --  1.9   Liver Function Tests: Recent Labs  Lab 12/31/20 0300  AST 26  ALT 9  ALKPHOS 56  BILITOT 2.2*  PROT 7.6  ALBUMIN 4.1   Recent Labs  Lab 12/31/20 0300  LIPASE 28   No results for input(s): AMMONIA in the last 168 hours. CBC: Recent Labs  Lab 12/31/20 0300  WBC 13.6*  NEUTROABS 11.0*  HGB 15.5*  HCT 48.2*  MCV 96.2  PLT 225   Cardiac Enzymes: No results for input(s): CKTOTAL, CKMB, CKMBINDEX, TROPONINI in the last 168 hours. BNP: Invalid input(s): POCBNP CBG: No results for input(s): GLUCAP in the last 168 hours.  Radiological Exams on Admission:  CT ABDOMEN PELVIS W CONTRAST  Result Date: 12/31/2020 CLINICAL DATA:  Right lower quadrant pain. EXAM: CT ABDOMEN AND PELVIS WITH CONTRAST TECHNIQUE: Multidetector CT imaging of the abdomen and pelvis was performed using the standard protocol following bolus administration of intravenous contrast. CONTRAST:  OMNIPAQUE IOHEXOL 300 MG/ML  SOLN COMPARISON:  None. FINDINGS: Lower chest:  No acute finding.  Aortic atherosclerosis. Hepatobiliary: Exophytic cysts from the inferior right liver measuring up to 2 cm where there is minimal peripheral calcification seen on coronal reformats.No evidence of biliary obstruction or stone. Pancreas: Generalized atrophy Spleen: Unremarkable. Adrenals/Urinary Tract: Negative adrenals. No hydronephrosis or stone. Unremarkable bladder. Stomach/Bowel: Thick walled appendix with mesoappendiceal fat stranding and 11 mm outer wall diameter. A tiny appendicolith could be present towards the base on reformats. Subtle lobulation and possible wall thinning present at the tip, but without abscess or extraluminal gas. The appendix is  in expected location in the right lower quadrant. No bowel obstruction/ileus. Vascular/Lymphatic: Diffuse atheromatous changes, expected for age. No mass or adenopathy. Reproductive:Hysterectomy Other: No ascites or pneumoperitoneum. Musculoskeletal: Lumbar spine degeneration with exaggerated lumbar lordosis. T10 hemangioma. IMPRESSION: Acute suppurative appendicitis. There may be wall thinning towards the tip but no discrete perforation or abscess. Electronically Signed   By: Marnee Spring M.D.   On: 12/31/2020 05:19   DG Chest Port 1 View  Result Date:  12/31/2020 CLINICAL DATA:  Cough, shortness of breath EXAM: PORTABLE CHEST 1 VIEW COMPARISON:  06/25/2010 FINDINGS: Some coarse patchy and interstitial opacity seen in the mid to lower lungs. Low volumes and atelectatic changes. Enlarged cardiac silhouette though may be partially accentuated by portable technique. Calcified tortuous aorta is noted as well. No visible pneumothorax or layering effusion. Telemetry leads overlie the chest. No acute osseous abnormality or suspicious osseous lesion. Left greater than right degenerative changes in the shoulders. IMPRESSION: Coarse patchy and interstitial opacity in the mid to lower lungs, could reflect atelectasis, edema or infection. Enlarged cardiac silhouette, possibly accentuated by portable technique. Aortic Atherosclerosis (ICD10-I70.0). Electronically Signed   By: Kreg Shropshire M.D.   On: 12/31/2020 01:29      EKG: atrial fibrillation, rate 102, LBBB   A & P   Principal Problem:   Appendicitis Active Problems:   Atrial fibrillation, chronic (HCC)   LBBB (left bundle branch block)   Respiratory failure (HCC)   Aortic atherosclerosis (HCC)   Essential hypertension   1. Sepsis without septic shock secondary to acute appendicitis a. Sepsis criteria: Tachycardia, tachypnea, leukocytosis with appendicitis b. Follow-up blood cultures c. Continue Zosyn d. Hold Eliquis for today in case patient  is going for surgery tomorrow. NPO after midnight e. This patient is high risk for perioperative cardiac complications as indicated by: RCRI Score 1 (Possible pulmonary edema on CXR), <4 METs as she is unable to climb a flight of stairs due to shortness of breath, new hypoxia concerning for CHF, age (87 years old) and history of atrial fibrillation, LBBB, HTN. Echo ordered as below  2. Acute hypoxic respiratory failure, unclear etiology a. Room air at baseline with 88% on room air per EMS and 96->90% on RA on physical exam, wheeze and lower extremity edema with history of cardiomyopathy, afib and LBBB. Concern for CHF exacerbation b. No echo on file -> preop echo  c. Incentive spirometry d. DuoNeb x1 e. Lasix 40 mg IV x1 f. Titrate to room air as able g. Daily weights and Intake/ouput  3. Permanent atrial fibrillation with LBBB a. CHA2DS2-VASc 5 (age, female, aortic atherosclerosis, hypertension) b. Hold Eliquis for upcoming surgery restart as soon as able per surgery  4. Hypertension a. Hold spironolactone due to K 4.8  5. Aortic atherosclerosis a. Lipid panel     DVT prophylaxis: heparin   Code Status: DNR  Diet: NPO pending surgery recommendations Family Communication: Admission, patients condition and plan of care including tests being ordered have been discussed with the patient who indicates understanding and agrees with the plan and Code Status.  Disposition Plan: The appropriate patient status for this patient is INPATIENT. Inpatient status is judged to be reasonable and necessary in order to provide the required intensity of service to ensure the patient's safety. The patient's presenting symptoms, physical exam findings, and initial radiographic and laboratory data in the context of their chronic comorbidities is felt to place them at high risk for further clinical deterioration. Furthermore, it is not anticipated that the patient will be medically stable for discharge from the  hospital within 2 midnights of admission. The following factors support the patient status of inpatient.   " The patient's presenting symptoms include abdominal pain. " The worrisome physical exam findings include hypoxia, wheeze, edema, abdominal pain. " The initial radiographic and laboratory data are worrisome because of appendicitis, leukocytosis, cxr-coarse patchy and interstitial opacity in the mid to lower lungs could reflect atelectasis, edema or infection. " The chronic  co-morbidities include afib, LBBB.    * I certify that at the point of admission it is my clinical judgment that the patient will require inpatient hospital care spanning beyond 2 midnights from the point of admission due to high intensity of service, high risk for further deterioration and high frequency of surveillance required.*     Consultants  . General surgery  Procedures  . none  Time Spent on Admission: 73 minutes    Jae Dire, DO [facility]  12/31/2020, 8:23 AM

## 2020-12-31 NOTE — ED Triage Notes (Signed)
Pt c/o diffuse abdominal pain for past two to three days.  Pt reports feeling abdominal discomfort in RLQ when getting into bed moving the right leg to get into bed.  Pt reports when sitting she feels discomfort on both sides of abdomen.  Denies n/v/d/fevers.  EMS reports pt 88% on room air, 2L [place] brings sats up to 96%. Pt a&ox4, from [facility] with DNR status/paperwork with her.

## 2020-12-31 NOTE — Consult Note (Signed)
Pamela Moody 1922/05/17  [id number].    Requesting MD: Dr. Melene Plan  Chief Complaint/Reason for Consult: Acute appendicitis  HPI:  This is a 87 year old white female with a history of hypertension, OSA for which she feels she does not have been gave away her CPAP machine, atrial fibrillation on Eliquis, last dose yesterday, and bilateral lower extremity edema for which she takes diuretic who was moving something last Saturday and felt some right lower quadrant abdominal pain and a tearing fashion.  This did improve; however, on Wednesday she began having recurrent right lower quadrant abdominal pain.  This was different in nature.  She denies nausea, vomiting, diarrhea, fevers, chills.  She only complains of pain in the right lower quadrant which has gotten worse.  Despite what appears to be an increased work of breathing, she denies being short of breath or having chest pain.  She is in atrial fibrillation which she states she has a history of and takes Eliquis for.  She currently lives in independent living at [facility].  Due to worsening pain, she presented to [facility] emergency department for further evaluation.  She has a CT scan that revealed acute appendicitis with no complicating features.  Her white blood cell count is slightly elevated at 13,000.  We have been asked to see her for further evaluation.  ROS: ROS: Please see HPI, otherwise all other systems have been reviewed and are negative.  History reviewed. No pertinent family history.  Past Medical History:  Diagnosis Date  . Atrial fibrillation (HCC)   . Hypertension   . OSA (obstructive sleep apnea)    non-compliant, gave away her CPAP machine    Past Surgical History:  Procedure Laterality Date  . ABDOMINAL HYSTERECTOMY    . JOINT REPLACEMENT    . TOTAL KNEE ARTHROPLASTY     bilat.   Marland Kitchen VASCULAR SURGERY     vericous vein surgery    Social History:  reports that she has never smoked. She does not have  any smokeless tobacco history on file. She reports that she does not drink alcohol and does not use drugs.  Allergies:  Allergies  Allergen Reactions  . Diltiazem     Other reaction(s): Other (See Comments) Unknown  Unknown    . Sulfa Antibiotics     Pt states causes swelling  . Morphine Nausea And Vomiting    (Not in a hospital admission)    Physical Exam: Blood pressure (!) 131/97, pulse 86, temperature (!) 97.5 F (36.4 C), temperature source Oral, resp. rate 19, height 5\' 1"  (1.549 m), weight 97 kg, SpO2 96 %. General: pleasant, obese, elderly white female who is laying in bed in NAD HEENT: head is normocephalic, atraumatic.  Sclera are noninjected.  PERRL.  Ears and nose without any masses or lesions.  Mouth is pink and dry Heart: Irregular, but rate controlled in the 80s.  Normal s1,s2. No obvious murmurs, gallops, or rubs noted.  Palpable radial and pedal pulses bilaterally Lungs: CTAB, no wheezes, rhonchi, or rales noted.  She does intermittently have some upper airway wheezing that is audible without a stethoscope.  Respiratory effort appears mildly labored however the patient states she is not having trouble breathing.  Her oxygen saturations are between 94 to 96% on 2 L nasal cannula which she does not wear at home. Abd: soft, NT, ND, +BS, no masses, hernias, or organomegaly MS: all 4 extremities are symmetrical with no cyanosis, clubbing.  +2 BLE edema  Skin: warm and dry with no masses, lesions, or rashes Neuro: Cranial nerves 2-12 grossly intact, sensation is normal throughout Psych: A&Ox3 with an appropriate affect.   Results for orders placed or performed during the hospital encounter of 12/31/20 (from the past 48 hour(s))  CBC with Differential     Status: Abnormal   Collection Time: 12/31/20  3:00 AM  Result Value Ref Range   WBC 13.6 (H) 4.0 - 10.5 K/uL   RBC 5.01 3.87 - 5.11 MIL/uL   Hemoglobin 15.5 (H) 12.0 - 15.0 g/dL   HCT 16.1 (H) 09.6 - 04.5 %   MCV  96.2 80.0 - 100.0 fL   MCH 30.9 26.0 - 34.0 pg   MCHC 32.2 30.0 - 36.0 g/dL   RDW 40.9 81.1 - 91.4 %   Platelets 225 150 - 400 K/uL   nRBC 0.0 0.0 - 0.2 %   Neutrophils Relative % 80 %   Neutro Abs 11.0 (H) 1.7 - 7.7 K/uL   Lymphocytes Relative 9 %   Lymphs Abs 1.2 0.7 - 4.0 K/uL   Monocytes Relative 10 %   Monocytes Absolute 1.3 (H) 0.1 - 1.0 K/uL   Eosinophils Relative 0 %   Eosinophils Absolute 0.0 0.0 - 0.5 K/uL   Basophils Relative 0 %   Basophils Absolute 0.0 0.0 - 0.1 K/uL   Immature Granulocytes 1 %   Abs Immature Granulocytes 0.07 0.00 - 0.07 K/uL    Comment: Performed at [facility], [address] [place], [place] [address]  Comprehensive metabolic panel     Status: Abnormal   Collection Time: 12/31/20  3:00 AM  Result Value Ref Range   Sodium 137 135 - 145 mmol/L   Potassium 4.8 3.5 - 5.1 mmol/L   Chloride 103 98 - 111 mmol/L   CO2 25 22 - 32 mmol/L   Glucose, Bld 120 (H) 70 - 99 mg/dL    Comment: Glucose reference range applies only to samples taken after fasting for at least 8 hours.   BUN 16 8 - 23 mg/dL   Creatinine, Ser 6.21 0.44 - 1.00 mg/dL   Calcium 9.1 8.9 - 30.8 mg/dL   Total Protein 7.6 6.5 - 8.1 g/dL   Albumin 4.1 3.5 - 5.0 g/dL   AST 26 15 - 41 U/L   ALT 9 0 - 44 U/L   Alkaline Phosphatase 56 38 - 126 U/L   Total Bilirubin 2.2 (H) 0.3 - 1.2 mg/dL   GFR, Estimated 55 (L) >60 mL/min    Comment: (NOTE) Calculated using the CKD-EPI Creatinine Equation (2021)    Anion gap 9 5 - 15    Comment: Performed at [facility], [address] [place], [place] [address]  Lipase, blood     Status: None   Collection Time: 12/31/20  3:00 AM  Result Value Ref Range   Lipase 28 11 - 51 U/L    Comment: Performed at [facility], [address] [place], [place] [address]  Resp Panel by RT-PCR (Flu A&B, Covid) Urine, Clean Catch     Status: None   Collection Time: 12/31/20  3:00 AM   Specimen: Urine,  Clean Catch; Nasopharyngeal(NP) swabs in vial transport medium  Result Value Ref Range   SARS Coronavirus 2 by RT PCR NEGATIVE NEGATIVE    Comment: (NOTE) SARS-CoV-2 target nucleic acids are NOT DETECTED.  The SARS-CoV-2 RNA is generally detectable in upper respiratory specimens during the acute phase of infection. The  lowest concentration of SARS-CoV-2 viral copies this assay can detect is 138 copies/mL. A negative result does not preclude SARS-Cov-2 infection and should not be used as the sole basis for treatment or other patient management decisions. A negative result may occur with  improper specimen collection/handling, submission of specimen other than nasopharyngeal swab, presence of viral mutation(s) within the areas targeted by this assay, and inadequate number of viral copies(<138 copies/mL). A negative result must be combined with clinical observations, patient history, and epidemiological information. The expected result is Negative.  Fact Sheet for Patients:  BloggerCourse.comhttps://www.fda.gov/media/152166/download  Fact Sheet for Healthcare Providers:  SeriousBroker.ithttps://www.fda.gov/media/152162/download  This test is no t yet approved or cleared by the [place] FDA and  has been authorized for detection and/or diagnosis of SARS-CoV-2 by FDA under an Emergency Use Authorization (EUA). This EUA will remain  in effect (meaning this test can be used) for the duration of the COVID-19 declaration under Section 564(b)(1) of the Act, 21 U.S.C.section 360bbb-3(b)(1), unless the authorization is terminated  or revoked sooner.       Influenza A by PCR NEGATIVE NEGATIVE   Influenza B by PCR NEGATIVE NEGATIVE    Comment: (NOTE) The Xpert Xpress SARS-CoV-2/FLU/RSV plus assay is intended as an aid in the diagnosis of influenza from Nasopharyngeal swab specimens and should not be used as a sole basis for treatment. Nasal washings and aspirates are unacceptable for Xpert Xpress  SARS-CoV-2/FLU/RSV testing.  Fact Sheet for Patients: BloggerCourse.comhttps://www.fda.gov/media/152166/download  Fact Sheet for Healthcare Providers: SeriousBroker.ithttps://www.fda.gov/media/152162/download  This test is not yet approved or cleared by the [place] FDA and has been authorized for detection and/or diagnosis of SARS-CoV-2 by FDA under an Emergency Use Authorization (EUA). This EUA will remain in effect (meaning this test can be used) for the duration of the COVID-19 declaration under Section 564(b)(1) of the Act, 21 U.S.C. section 360bbb-3(b)(1), unless the authorization is terminated or revoked.  Performed at [facility], [address] [place], [place] [address]   Urinalysis, Routine w reflex microscopic Urine, Clean Catch     Status: Abnormal   Collection Time: 12/31/20  3:00 AM  Result Value Ref Range   Color, Urine STRAW (A) YELLOW   APPearance CLEAR CLEAR   Specific Gravity, Urine 1.004 (L) 1.005 - 1.030   pH 7.0 5.0 - 8.0   Glucose, UA NEGATIVE NEGATIVE mg/dL   Hgb urine dipstick NEGATIVE NEGATIVE   Bilirubin Urine NEGATIVE NEGATIVE   Ketones, ur NEGATIVE NEGATIVE mg/dL   Protein, ur NEGATIVE NEGATIVE mg/dL   Nitrite NEGATIVE NEGATIVE   Leukocytes,Ua NEGATIVE NEGATIVE    Comment: Performed at [facility], [address] [place], [place] [address]  Lactic acid, plasma     Status: None   Collection Time: 12/31/20  3:00 AM  Result Value Ref Range   Lactic Acid, Venous 1.5 0.5 - 1.9 mmol/L    Comment: Performed at [facility], [address] [place], [place] [address]  I-Stat beta hCG blood, ED ([facility], [facility], [facility] only)     Status: None   Collection Time: 12/31/20  3:02 AM  Result Value Ref Range   I-stat hCG, quantitative <5.0 <5 mIU/mL   Comment 3            Comment:   GEST. AGE      CONC.  (mIU/mL)   <=1 WEEK        5 - 50     2 WEEKS  50 - 500     3 WEEKS       100 - 10,000     4 WEEKS     1,000 - 30,000         FEMALE AND NON-PREGNANT FEMALE:     LESS THAN 5 mIU/mL   Lactic acid, plasma     Status: None   Collection Time: 12/31/20  5:58 AM  Result Value Ref Range   Lactic Acid, Venous 0.8 0.5 - 1.9 mmol/L    Comment: Performed at [facility], [address] [place], [place] [address]  Magnesium     Status: None   Collection Time: 12/31/20  5:58 AM  Result Value Ref Range   Magnesium 1.9 1.7 - 2.4 mg/dL    Comment: Performed at [facility], [address] [place], [place] [address]  Lipid panel     Status: Abnormal   Collection Time: 12/31/20  5:58 AM  Result Value Ref Range   Cholesterol 183 0 - 200 mg/dL   Triglycerides 38 <831 mg/dL   HDL 65 >51 mg/dL   Total CHOL/HDL Ratio 2.8 RATIO   VLDL 8 0 - 40 mg/dL   LDL Cholesterol 761 (H) 0 - 99 mg/dL    Comment:        Total Cholesterol/HDL:CHD Risk Coronary Heart Disease Risk Table                     Men   Women  1/2 Average Risk   3.4   3.3  Average Risk       5.0   4.4  2 X Average Risk   9.6   7.1  3 X Average Risk  23.4   11.0        Use the calculated Patient Ratio above and the CHD Risk Table to determine the patient's CHD Risk.        ATP III CLASSIFICATION (LDL):  <100     mg/dL   Optimal  607-371  mg/dL   Near or Above                    Optimal  130-159  mg/dL   Borderline  062-694  mg/dL   High  >854     mg/dL   Very High Performed at [facility], [address] [place], [place] [address]    CT ABDOMEN PELVIS W CONTRAST  Result Date: 12/31/2020 CLINICAL DATA:  Right lower quadrant pain. EXAM: CT ABDOMEN AND PELVIS WITH CONTRAST TECHNIQUE: Multidetector CT imaging of the abdomen and pelvis was performed using the standard protocol following bolus administration of intravenous contrast. CONTRAST:  OMNIPAQUE IOHEXOL 300 MG/ML  SOLN COMPARISON:  None. FINDINGS: Lower chest:  No acute finding.  Aortic atherosclerosis. Hepatobiliary: Exophytic cysts from  the inferior right liver measuring up to 2 cm where there is minimal peripheral calcification seen on coronal reformats.No evidence of biliary obstruction or stone. Pancreas: Generalized atrophy Spleen: Unremarkable. Adrenals/Urinary Tract: Negative adrenals. No hydronephrosis or stone. Unremarkable bladder. Stomach/Bowel: Thick walled appendix with mesoappendiceal fat stranding and 11 mm outer wall diameter. A tiny appendicolith could be present towards the base on reformats. Subtle lobulation and possible wall thinning present at the tip, but without abscess or extraluminal gas. The appendix is in expected location in the right lower quadrant. No bowel obstruction/ileus. Vascular/Lymphatic: Diffuse atheromatous changes, expected for age. No mass or adenopathy. Reproductive:Hysterectomy Other: No ascites or pneumoperitoneum.  Musculoskeletal: Lumbar spine degeneration with exaggerated lumbar lordosis. T10 hemangioma. IMPRESSION: Acute suppurative appendicitis. There may be wall thinning towards the tip but no discrete perforation or abscess. Electronically Signed   By: Marnee Spring M.D.   On: 12/31/2020 05:19   DG Chest Port 1 View  Result Date: 12/31/2020 CLINICAL DATA:  Cough, shortness of breath EXAM: PORTABLE CHEST 1 VIEW COMPARISON:  06/25/2010 FINDINGS: Some coarse patchy and interstitial opacity seen in the mid to lower lungs. Low volumes and atelectatic changes. Enlarged cardiac silhouette though may be partially accentuated by portable technique. Calcified tortuous aorta is noted as well. No visible pneumothorax or layering effusion. Telemetry leads overlie the chest. No acute osseous abnormality or suspicious osseous lesion. Left greater than right degenerative changes in the shoulders. IMPRESSION: Coarse patchy and interstitial opacity in the mid to lower lungs, could reflect atelectasis, edema or infection. Enlarged cardiac silhouette, possibly accentuated by portable technique. Aortic  Atherosclerosis (ICD10-I70.0). Electronically Signed   By: Kreg Shropshire M.D.   On: 12/31/2020 01:29      Assessment/Plan HTN Atrial fibrillation -on Eliquis, hold currently OSA -patient feels as if she does not have this and does not wear CPAP BLE edema - I do not see any echo in our system.  Given her increased work of breathing as well as her lower extremity edema, it would be ideal to get an echo as well as have cardiology see her for risk stratification in case she were to need surgical intervention.  Acute appendicitis The patient is noted to have acute appendicitis on her CT scan which has been reviewed.  She may have a very small appendicolith noted but it is difficult to tell.  She does take Eliquis and her last dose was yesterday.  We would recommend holding this in the case that she were to need surgical intervention.  We would like to treat her conservatively and get her better with just antibiotics however if this fails she may require laparoscopic appendectomy.  In the interim, we recommend further risk stratification from a cardiac standpoint in regards to her lower extremity edema as well as her increased work of breathing.  We would recommend medical admission.  I have relayed these concerns with the primary service who is going to ask cardiology to evaluate the patient as well as order an echocardiogram.  She has been started on Zosyn.  She will remain n.p.o. at this time.  We will follow the patient closely with you.   FEN -NPO/IVF VTE -heparin, Eliquis on hold ID -Zosyn   Letha Cape, [facility] Surgery 12/31/2020, 9:37 AM Please see Amion for pager number during day hours 7:00am-4:30pm or 7:00am -11:30am on weekends

## 2021-01-01 ENCOUNTER — Encounter (HOSPITAL_COMMUNITY)
Admission: EM | Disposition: A | Discharge status: Home / Self Care | Payer: Self-pay | Source: Home / Self Care | Attending: Internal Medicine

## 2021-01-01 ENCOUNTER — Encounter (HOSPITAL_COMMUNITY): Payer: Self-pay | Admitting: Internal Medicine

## 2021-01-01 ENCOUNTER — Inpatient Hospital Stay (HOSPITAL_COMMUNITY): Payer: Medicare Other | Admitting: Anesthesiology

## 2021-01-01 DIAGNOSIS — I447 Left bundle-branch block, unspecified: Secondary | ICD-10-CM

## 2021-01-01 DIAGNOSIS — K3589 Other acute appendicitis without perforation or gangrene: Secondary | ICD-10-CM

## 2021-01-01 DIAGNOSIS — I482 Chronic atrial fibrillation, unspecified: Secondary | ICD-10-CM

## 2021-01-01 HISTORY — PX: LAPAROSCOPIC APPENDECTOMY: SHX408

## 2021-01-01 LAB — BASIC METABOLIC PANEL
Anion gap: 9 (ref 5–15)
BUN: 19 mg/dL (ref 8–23)
CO2: 27 mmol/L (ref 22–32)
Calcium: 8.2 mg/dL — ABNORMAL LOW (ref 8.9–10.3)
Chloride: 102 mmol/L (ref 98–111)
Creatinine, Ser: 0.99 mg/dL (ref 0.44–1.00)
GFR, Estimated: 52 mL/min — ABNORMAL LOW (ref >60)
Glucose, Bld: 90 mg/dL (ref 70–99)
Potassium: 3.9 mmol/L (ref 3.5–5.1)
Sodium: 138 mmol/L (ref 135–145)

## 2021-01-01 LAB — CBC
HCT: 43.6 % (ref 36.0–46.0)
Hemoglobin: 13.8 g/dL (ref 12.0–15.0)
MCH: 30.8 pg (ref 26.0–34.0)
MCHC: 31.7 g/dL (ref 30.0–36.0)
MCV: 97.3 fL (ref 80.0–100.0)
Platelets: 187 10*3/uL (ref 150–400)
RBC: 4.48 MIL/uL (ref 3.87–5.11)
RDW: 14 % (ref 11.5–15.5)
WBC: 9.4 10*3/uL (ref 4.0–10.5)
nRBC: 0 % (ref 0.0–0.2)

## 2021-01-01 SURGERY — APPENDECTOMY, LAPAROSCOPIC
Anesthesia: General | Site: Abdomen

## 2021-01-01 MED ORDER — OXYCODONE HCL 5 MG/5ML PO SOLN
5.0000 mg | Freq: Once | ORAL | Status: DC | PRN
Start: 2021-01-01 — End: 2021-01-01

## 2021-01-01 MED ORDER — 0.9 % SODIUM CHLORIDE (POUR BTL) OPTIME
TOPICAL | Status: DC | PRN
  Administered 2021-01-01: 1000 mL

## 2021-01-01 MED ORDER — PROPOFOL 10 MG/ML IV BOLUS
INTRAVENOUS | Status: DC | PRN
  Administered 2021-01-01: 90 mg via INTRAVENOUS

## 2021-01-01 MED ORDER — LACTATED RINGERS IR SOLN
Status: DC | PRN
  Administered 2021-01-01: 1000 mL

## 2021-01-01 MED ORDER — PHENYLEPHRINE 40 MCG/ML (10ML) SYRINGE FOR IV PUSH (FOR BLOOD PRESSURE SUPPORT)
PREFILLED_SYRINGE | INTRAVENOUS | Status: AC
  Filled 2021-01-01: qty 10

## 2021-01-01 MED ORDER — SUGAMMADEX SODIUM 200 MG/2ML IV SOLN
INTRAVENOUS | Status: DC | PRN
  Administered 2021-01-01: 200 mg via INTRAVENOUS

## 2021-01-01 MED ORDER — FENTANYL CITRATE (PF) 100 MCG/2ML IJ SOLN
INTRAMUSCULAR | Status: AC
  Filled 2021-01-01: qty 2

## 2021-01-01 MED ORDER — LABETALOL HCL 5 MG/ML IV SOLN
INTRAVENOUS | Status: DC | PRN
  Administered 2021-01-01: 5 mg via INTRAVENOUS
  Administered 2021-01-01: 2.5 mg via INTRAVENOUS

## 2021-01-01 MED ORDER — ALBUTEROL SULFATE (2.5 MG/3ML) 0.083% IN NEBU
2.5000 mg | INHALATION_SOLUTION | Freq: Once | RESPIRATORY_TRACT | Status: AC
  Administered 2021-01-01: 2.5 mg via RESPIRATORY_TRACT

## 2021-01-01 MED ORDER — ROCURONIUM BROMIDE 10 MG/ML (PF) SYRINGE
PREFILLED_SYRINGE | INTRAVENOUS | Status: DC | PRN
  Administered 2021-01-01: 40 mg via INTRAVENOUS

## 2021-01-01 MED ORDER — BUPIVACAINE-EPINEPHRINE (PF) 0.25% -1:200000 IJ SOLN
INTRAMUSCULAR | Status: AC
  Filled 2021-01-01: qty 30

## 2021-01-01 MED ORDER — ONDANSETRON HCL 4 MG/2ML IJ SOLN: 4.0000 mg | INTRAMUSCULAR | Status: DC | PRN

## 2021-01-01 MED ORDER — METOPROLOL TARTRATE 5 MG/5ML IV SOLN: 5.0000 mg | Freq: Four times a day (QID) | INTRAVENOUS | Status: DC | PRN

## 2021-01-01 MED ORDER — DEXAMETHASONE SODIUM PHOSPHATE 10 MG/ML IJ SOLN
INTRAMUSCULAR | Status: DC | PRN
  Administered 2021-01-01: 8 mg via INTRAVENOUS

## 2021-01-01 MED ORDER — ONDANSETRON HCL 4 MG/2ML IJ SOLN
INTRAMUSCULAR | Status: AC
  Filled 2021-01-01: qty 2

## 2021-01-01 MED ORDER — LABETALOL HCL 5 MG/ML IV SOLN
INTRAVENOUS | Status: AC
  Filled 2021-01-01: qty 4

## 2021-01-01 MED ORDER — DEXAMETHASONE SODIUM PHOSPHATE 10 MG/ML IJ SOLN
INTRAMUSCULAR | Status: AC
  Filled 2021-01-01: qty 1

## 2021-01-01 MED ORDER — ACETAMINOPHEN 325 MG PO TABS: 650.0000 mg | ORAL_TABLET | Freq: Four times a day (QID) | ORAL | Status: DC

## 2021-01-01 MED ORDER — LIDOCAINE 2% (20 MG/ML) 5 ML SYRINGE
INTRAMUSCULAR | Status: DC | PRN
  Administered 2021-01-01: 40 mg via INTRAVENOUS

## 2021-01-01 MED ORDER — LIDOCAINE 2% (20 MG/ML) 5 ML SYRINGE
INTRAMUSCULAR | Status: AC
  Filled 2021-01-01: qty 5

## 2021-01-01 MED ORDER — PHENYLEPHRINE 40 MCG/ML (10ML) SYRINGE FOR IV PUSH (FOR BLOOD PRESSURE SUPPORT)
PREFILLED_SYRINGE | INTRAVENOUS | Status: DC | PRN
  Administered 2021-01-01: 80 ug via INTRAVENOUS

## 2021-01-01 MED ORDER — LACTATED RINGERS IV SOLN: INTRAVENOUS | Status: DC | PRN

## 2021-01-01 MED ORDER — FENTANYL CITRATE (PF) 100 MCG/2ML IJ SOLN: 12.5000 ug | INTRAMUSCULAR | Status: DC | PRN

## 2021-01-01 MED ORDER — FENTANYL CITRATE (PF) 100 MCG/2ML IJ SOLN: 25.0000 ug | INTRAMUSCULAR | Status: DC | PRN

## 2021-01-01 MED ORDER — OXYCODONE HCL 5 MG PO TABS
5.0000 mg | ORAL_TABLET | Freq: Once | ORAL | Status: DC | PRN
Start: 2021-01-01 — End: 2021-01-01

## 2021-01-01 MED ORDER — FENTANYL CITRATE (PF) 100 MCG/2ML IJ SOLN
INTRAMUSCULAR | Status: DC | PRN
  Administered 2021-01-01 (×2): 25 ug via INTRAVENOUS
  Administered 2021-01-01: 50 ug via INTRAVENOUS

## 2021-01-01 MED ORDER — BUPIVACAINE-EPINEPHRINE 0.25% -1:200000 IJ SOLN
INTRAMUSCULAR | Status: DC | PRN
  Administered 2021-01-01: 20 mL

## 2021-01-01 MED ORDER — TRAMADOL HCL 50 MG PO TABS: 50.0000 mg | ORAL_TABLET | Freq: Four times a day (QID) | ORAL | Status: DC | PRN

## 2021-01-01 MED ORDER — ALBUTEROL SULFATE (2.5 MG/3ML) 0.083% IN NEBU
INHALATION_SOLUTION | RESPIRATORY_TRACT | Status: AC
  Filled 2021-01-01: qty 3

## 2021-01-01 MED ORDER — ONDANSETRON HCL 4 MG/2ML IJ SOLN: 4.0000 mg | Freq: Once | INTRAMUSCULAR | Status: DC | PRN

## 2021-01-01 MED ORDER — ONDANSETRON HCL 4 MG/2ML IJ SOLN
INTRAMUSCULAR | Status: DC | PRN
  Administered 2021-01-01: 4 mg via INTRAVENOUS

## 2021-01-01 SURGICAL SUPPLY — 53 items
ADH SKN CLS APL DERMABOND .7 (GAUZE/BANDAGES/DRESSINGS) ×1
APL PRP STRL LF DISP 70% ISPRP (MISCELLANEOUS) ×1
APPLIER CLIP 5 13 M/L LIGAMAX5 (MISCELLANEOUS)
APPLIER CLIP ROT 10 11.4 M/L (STAPLE)
APR CLP MED LRG 11.4X10 (STAPLE)
APR CLP MED LRG 5 ANG JAW (MISCELLANEOUS)
BAG SPEC RTRVL LRG 6X4 10 (ENDOMECHANICALS) ×1
CABLE HIGH FREQUENCY MONO STRZ (ELECTRODE) ×2 IMPLANT
CHLORAPREP W/TINT 26 (MISCELLANEOUS) ×2 IMPLANT
CLIP APPLIE 5 13 M/L LIGAMAX5 (MISCELLANEOUS) IMPLANT
CLIP APPLIE ROT 10 11.4 M/L (STAPLE) IMPLANT
COVER SURGICAL LIGHT HANDLE (MISCELLANEOUS) ×2 IMPLANT
COVER WAND RF STERILE (DRAPES) IMPLANT
CUTTER FLEX LINEAR 45M (STAPLE) IMPLANT
DECANTER SPIKE VIAL GLASS SM (MISCELLANEOUS) ×2 IMPLANT
DERMABOND ADVANCED (GAUZE/BANDAGES/DRESSINGS) ×1
DERMABOND ADVANCED .7 DNX12 (GAUZE/BANDAGES/DRESSINGS) ×1 IMPLANT
DRAIN CHANNEL 19F RND (DRAIN) IMPLANT
ELECT REM PT RETURN 15FT ADLT (MISCELLANEOUS) ×2 IMPLANT
ENDOLOOP SUT PDS II  0 18 (SUTURE)
ENDOLOOP SUT PDS II 0 18 (SUTURE) IMPLANT
EVACUATOR SILICONE 100CC (DRAIN) IMPLANT
GLOVE SURG ENC MOIS LTX SZ6 (GLOVE) ×2 IMPLANT
GLOVE SURG UNDER LTX SZ6.5 (GLOVE) ×2 IMPLANT
GOWN STRL REUS W/ TWL LRG LVL3 (GOWN DISPOSABLE) ×1 IMPLANT
GOWN STRL REUS W/TWL LRG LVL3 (GOWN DISPOSABLE) ×2
GOWN STRL REUS W/TWL XL LVL3 (GOWN DISPOSABLE) ×2 IMPLANT
GRASPER SUT TROCAR 14GX15 (MISCELLANEOUS) IMPLANT
IRRIG SUCT STRYKERFLOW 2 WTIP (MISCELLANEOUS) ×2
IRRIGATION SUCT STRKRFLW 2 WTP (MISCELLANEOUS) ×1 IMPLANT
KIT BASIN OR (CUSTOM PROCEDURE TRAY) ×2 IMPLANT
KIT TURNOVER KIT A (KITS) ×2 IMPLANT
NEEDLE INSUFFLATION 14GA 120MM (NEEDLE) ×2 IMPLANT
PENCIL SMOKE EVACUATOR (MISCELLANEOUS) IMPLANT
POUCH SPECIMEN RETRIEVAL 10MM (ENDOMECHANICALS) ×2 IMPLANT
RELOAD 45 VASCULAR/THIN (ENDOMECHANICALS) IMPLANT
RELOAD STAPLE 45 2.5 WHT GRN (ENDOMECHANICALS) IMPLANT
RELOAD STAPLE TA45 3.5 REG BLU (ENDOMECHANICALS) ×2 IMPLANT
SCISSORS LAP 5X35 DISP (ENDOMECHANICALS) ×2 IMPLANT
SET TUBE SMOKE EVAC HIGH FLOW (TUBING) ×2 IMPLANT
SHEARS HARMONIC ACE PLUS 36CM (ENDOMECHANICALS) IMPLANT
SLEEVE XCEL OPT CAN 5 100 (ENDOMECHANICALS) ×2 IMPLANT
SUT ETHILON 2 0 PS N (SUTURE) IMPLANT
SUT MNCRL AB 4-0 PS2 18 (SUTURE) ×2 IMPLANT
SUT PDS AB 1 CT1 27 (SUTURE) ×1 IMPLANT
TOWEL OR 17X26 10 PK STRL BLUE (TOWEL DISPOSABLE) ×2 IMPLANT
TOWEL OR NON WOVEN STRL DISP B (DISPOSABLE) ×2 IMPLANT
TRAY FOLEY MTR SLVR 14FR STAT (SET/KITS/TRAYS/PACK) IMPLANT
TRAY FOLEY MTR SLVR 16FR STAT (SET/KITS/TRAYS/PACK) IMPLANT
TRAY LAPAROSCOPIC (CUSTOM PROCEDURE TRAY) ×2 IMPLANT
TROCAR BLADELESS OPT 5 100 (ENDOMECHANICALS) ×2 IMPLANT
TROCAR XCEL 12X100 BLDLESS (ENDOMECHANICALS) ×2 IMPLANT
TROCAR XCEL BLUNT TIP 100MML (ENDOMECHANICALS) ×2 IMPLANT

## 2021-01-01 NOTE — Op Note (Signed)
Date: 01/01/21  Patient: Pamela Moody MRN: [id number]  Preoperative Diagnosis: Acute appendicitis without perforation Postoperative Diagnosis: Same  Procedure: Laparoscopic appendectomy  Surgeon: Sophronia Simas, MD  EBL: Minimal  Anesthesia: General  Specimens: Appendix  Indications: Pamela Moody is a 87 yo female who presented with abdominal pain and imaging findings of acute appendicitis without perforation. She was initially treated nonoperatively with IV antibiotics, but has continued to have RLQ pain and tenderness. After a discussion of the risks of surgery vs continued nonoperative management, she preferred to undergo appendectomy.  Findings: Acute appendicitis without perforation or abscess.  Procedure details: Informed consent was obtained in the preoperative area prior to the procedure. The patient was brought to the operating room and placed on the table in the supine position. General anesthesia was induced and appropriate lines and drains were placed for intraoperative monitoring. Perioperative antibiotics were administered per SCIP guidelines. The abdomen was prepped and draped in the usual sterile fashion. A pre-procedure timeout was taken verifying patient identity, surgical site and procedure to be performed.  A small infraumbilical skin incision was made and the subcutaneous tissue was spread to expose the fascia. The umbilical stalk was grasped and elevated, and a small hernia defect was identified at the site of her previous lower midline incision, containing preperitoneal fat. This defect was extended with a scalpel and the peritoneal cavity was entered. A 5mm Hasson trocar was inserted. The abdomen was inspected with no evidence of visceral or vascular injury. A suprapubic 45mm port was placed, followed by a 27mm port in the LLQ, both under direct visualization. The appendix was identified in the RLQ, and was dilated and inflamed but in tact. The ligament of Treeves  was adherent to the appendix with mild adjacent inflammation of the terminal ileum. Treeves ligament was gently separated from the appendix using blunt dissection. The terminal ileum was examined and in tact without injury. The appendix was grasped and elevated. The retroperitoneal attachments were taken down with the harmonic. A mesenteric window was bluntly created at the base of the appendix, and the appendix was divided at the base from the cecum using a 51mm stapler with a blue load. The mesoappendix was then divided with the Harmonic. The specimen was placed in an endocatch bag and removed and sent for routine pathology. The surgical site was irrigated. The staple line was inspected and a clip was placed on a site of bleeding; this area was hemostatic after clip placement. The cut edge of the mesoappendix was hemostatic. The surgical site was irrigated. The ports were removed and the pneumoperitoneum was evacuated. The umbilical port site fascia was closed with a 1 PDS suture. The skin at all port sites was closed with 4-0 monocryl subcuticular suture. Dermabond was applied.  The patient tolerated the procedure with no apparent complications. All counts were correct x2 at the end of the procedure. The patient was extubated and taken to PACU in stable condition.  Sophronia Simas, MD 01/01/21 3:21 PM

## 2021-01-01 NOTE — Anesthesia Preprocedure Evaluation (Addendum)
Anesthesia Evaluation  Patient identified by MRN, date of birth, ID band Patient awake    Reviewed: Allergy & Precautions, NPO status , Patient's Chart, lab work & pertinent test results  History of Anesthesia Complications Negative for: history of anesthetic complications  Airway Mallampati: II  TM Distance: >3 FB Neck ROM: Full    Dental  (+) Teeth Intact, Missing   Pulmonary sleep apnea ,    Pulmonary exam normal        Cardiovascular hypertension, +CHF  Normal cardiovascular exam+ dysrhythmias Atrial Fibrillation      Neuro/Psych negative neurological ROS     GI/Hepatic negative GI ROS, Neg liver ROS,   Endo/Other  Morbid obesity  Renal/GU negative Renal ROS  negative genitourinary   Musculoskeletal negative musculoskeletal ROS (+)   Abdominal   Peds  Hematology Eliquis   Anesthesia Other Findings  Echo 12/31/20: EF 40-45%, global LV hypokinesis, mod LVH, normal RV function, mod LAD, mild RAD, valves unremarkable  Reproductive/Obstetrics                           Anesthesia Physical Anesthesia Plan  ASA: III  Anesthesia Plan: General   Post-op Pain Management:    Induction: Intravenous and Rapid sequence  PONV Risk Score and Plan: 3 and Ondansetron, Dexamethasone and Treatment may vary due to age or medical condition  Airway Management Planned: Oral ETT  Additional Equipment: None  Intra-op Plan:   Post-operative Plan: Extubation in OR  Informed Consent: I have reviewed the patients History and Physical, chart, labs and discussed the procedure including the risks, benefits and alternatives for the proposed anesthesia with the patient or authorized representative who has indicated his/her understanding and acceptance.   Patient has DNR.  Discussed DNR with patient and Continue DNR.   Dental advisory given  Plan Discussed with:   Anesthesia Plan Comments:         Anesthesia Quick Evaluation

## 2021-01-01 NOTE — Progress Notes (Signed)
Progress Note     Subjective: Patient reports pain is overall improving but she is still significantly ttp with palpation of RLQ. Denies nausea. No flatus or BM. She is very adamant that she would like to have surgery. If she can't have surgery then she prefers to go home on PO antibiotics. She understands that given her age and some cardiac dysfunction that she is at increased risk of surgical complication.   Objective: Vital signs in last 24 hours: Temp:  [97.5 F (36.4 C)-98 F (36.7 C)] 97.6 F (36.4 C) (03/25 0445) Pulse Rate:  [76-105] 99 (03/25 0445) Resp:  [15-20] 20 (03/25 0445) BP: (119-145)/(61-97) 119/69 (03/25 0445) SpO2:  [90 %-100 %] 90 % (03/25 0445) Last BM Date: 12/30/20  Intake/Output from previous day: 03/24 0701 - 03/25 0700 In: 400 [IV Piggyback:400] Out: 500 [Urine:500] Intake/Output this shift: No intake/output data recorded.  PE: General: pleasant, obese, elderly white female who is laying in bed in NAD HEENT: head is normocephalic, atraumatic.  Sclera are noninjected.  PERRL.  Ears and nose without any masses or lesions.  Mouth is pink and dry Heart: Irregular, not tachycardic.  Normal s1,s2. No obvious murmurs, gallops, or rubs noted.  Palpable radial and pedal pulses bilaterally Lungs: CTAB, no wheezes, rhonchi, or rales noted.  respiratory effort non-labored on room air this AM Abd: soft, ttp in RLQ without peritonitis, ND, +BS, no masses, hernias, or organomegaly MS: all 4 extremities are symmetrical with no cyanosis, clubbing.  +2 BLE edema Skin: warm and dry with no masses, lesions, or rashes Neuro: Cranial nerves 2-12 grossly intact, sensation is normal throughout Psych: A&Ox3 with an appropriate affect.   Lab Results:  Recent Labs    12/31/20 0300 01/01/21 0600  WBC 13.6* 9.4  HGB 15.5* 13.8  HCT 48.2* 43.6  PLT 225 187   BMET Recent Labs    12/31/20 0300 01/01/21 0600  NA 137 138  K 4.8 3.9  CL 103 102  CO2 25 27  GLUCOSE  120* 90  BUN 16 19  CREATININE 0.95 0.99  CALCIUM 9.1 8.2*   PT/INR No results for input(s): LABPROT, INR in the last 72 hours. CMP     Component Value Date/Time   NA 138 01/01/2021 0600   K 3.9 01/01/2021 0600   CL 102 01/01/2021 0600   CO2 27 01/01/2021 0600   GLUCOSE 90 01/01/2021 0600   BUN 19 01/01/2021 0600   CREATININE 0.99 01/01/2021 0600   CALCIUM 8.2 (L) 01/01/2021 0600   PROT 7.6 12/31/2020 0300   ALBUMIN 4.1 12/31/2020 0300   AST 26 12/31/2020 0300   ALT 9 12/31/2020 0300   ALKPHOS 56 12/31/2020 0300   BILITOT 2.2 (H) 12/31/2020 0300   GFRNONAA 52 (L) 01/01/2021 0600   GFRAA  06/25/2010 1719    >60        The eGFR has been calculated using the MDRD equation. This calculation has not been validated in all clinical situations. eGFR's persistently <60 mL/min signify possible Chronic Kidney Disease.   Lipase     Component Value Date/Time   LIPASE 28 12/31/2020 0300       Studies/Results: CT ABDOMEN PELVIS W CONTRAST  Result Date: 12/31/2020 CLINICAL DATA:  Right lower quadrant pain. EXAM: CT ABDOMEN AND PELVIS WITH CONTRAST TECHNIQUE: Multidetector CT imaging of the abdomen and pelvis was performed using the standard protocol following bolus administration of intravenous contrast. CONTRAST:  187m OMNIPAQUE IOHEXOL 300 MG/ML  SOLN COMPARISON:  None.  FINDINGS: Lower chest:  No acute finding.  Aortic atherosclerosis. Hepatobiliary: Exophytic cysts from the inferior right liver measuring up to 2 cm where there is minimal peripheral calcification seen on coronal reformats.No evidence of biliary obstruction or stone. Pancreas: Generalized atrophy Spleen: Unremarkable. Adrenals/Urinary Tract: Negative adrenals. No hydronephrosis or stone. Unremarkable bladder. Stomach/Bowel: Thick walled appendix with mesoappendiceal fat stranding and 11 mm outer wall diameter. A tiny appendicolith could be present towards the base on reformats. Subtle lobulation and possible wall  thinning present at the tip, but without abscess or extraluminal gas. The appendix is in expected location in the right lower quadrant. No bowel obstruction/ileus. Vascular/Lymphatic: Diffuse atheromatous changes, expected for age. No mass or adenopathy. Reproductive:Hysterectomy Other: No ascites or pneumoperitoneum. Musculoskeletal: Lumbar spine degeneration with exaggerated lumbar lordosis. T10 hemangioma. IMPRESSION: Acute suppurative appendicitis. There may be wall thinning towards the tip but no discrete perforation or abscess. Electronically Signed   By: Monte Fantasia M.D.   On: 12/31/2020 05:19   DG Chest Port 1 View  Result Date: 12/31/2020 CLINICAL DATA:  Cough, shortness of breath EXAM: PORTABLE CHEST 1 VIEW COMPARISON:  06/25/2010 FINDINGS: Some coarse patchy and interstitial opacity seen in the mid to lower lungs. Low volumes and atelectatic changes. Enlarged cardiac silhouette though may be partially accentuated by portable technique. Calcified tortuous aorta is noted as well. No visible pneumothorax or layering effusion. Telemetry leads overlie the chest. No acute osseous abnormality or suspicious osseous lesion. Left greater than right degenerative changes in the shoulders. IMPRESSION: Coarse patchy and interstitial opacity in the mid to lower lungs, could reflect atelectasis, edema or infection. Enlarged cardiac silhouette, possibly accentuated by portable technique. Aortic Atherosclerosis (ICD10-I70.0). Electronically Signed   By: Lovena Le M.D.   On: 12/31/2020 01:29   ECHOCARDIOGRAM COMPLETE  Result Date: 12/31/2020    ECHOCARDIOGRAM REPORT   Patient Name:   RENARDA MULLINIX Date of Exam: 12/31/2020 Medical Rec #:  [id number]          Height:       61.0 in Accession #:    [id number]         Weight:       213.8 lb Date of Birth:  10/23/1921           BSA:          1.943 m Patient Age:    29 years           BP:           126/68 mmHg Patient Gender: F                  HR:           81  bpm. Exam Location:  Inpatient Procedure: 2D Echo, Cardiac Doppler and Color Doppler Indications:    Dyspnea  History:        Patient has no prior history of Echocardiogram examinations.                 Cardiomegaly, Arrythmias:Atrial Fibrillation and LBBB,                 Signs/Symptoms:Abdominal pain, appendicitis and Dyspnea; Risk                 Factors:Hypertension and obesity.  Sonographer:    Dustin Flock Referring Phys: [id number] JARED E SEGAL  Sonographer Comments: Patient is morbidly obese. IMPRESSIONS  1. Left ventricular ejection fraction, by estimation, is 40 to 45%. The left ventricle has mildly  decreased function. The left ventricle demonstrates global hypokinesis. There is moderate concentric left ventricular hypertrophy. Left ventricular diastolic function could not be evaluated.  2. Right ventricular systolic function is normal. The right ventricular size is normal. There is normal pulmonary artery systolic pressure. The estimated right ventricular systolic pressure is 98.1 mmHg.  3. Left atrial size was moderately dilated.  4. Right atrial size was mildly dilated.  5. The mitral valve is normal in structure. No evidence of mitral valve regurgitation. No evidence of mitral stenosis.  6. The aortic valve is calcified. There is mild calcification of the aortic valve. There is mild thickening of the aortic valve. Aortic valve regurgitation is not visualized. No aortic stenosis is present.  7. The inferior vena cava is normal in size with greater than 50% respiratory variability, suggesting right atrial pressure of 3 mmHg. FINDINGS  Left Ventricle: Left ventricular ejection fraction, by estimation, is 40 to 45%. The left ventricle has mildly decreased function. The left ventricle demonstrates global hypokinesis. The left ventricular internal cavity size was normal in size. There is  moderate concentric left ventricular hypertrophy. Abnormal (paradoxical) septal motion, consistent with left bundle  branch block. Left ventricular diastolic function could not be evaluated due to atrial fibrillation. Left ventricular diastolic function could not be evaluated. Right Ventricle: The right ventricular size is normal. No increase in right ventricular wall thickness. Right ventricular systolic function is normal. There is normal pulmonary artery systolic pressure. The tricuspid regurgitant velocity is 2.78 m/s, and  with an assumed right atrial pressure of 3 mmHg, the estimated right ventricular systolic pressure is 19.1 mmHg. Left Atrium: Left atrial size was moderately dilated. Right Atrium: Right atrial size was mildly dilated. Pericardium: There is no evidence of pericardial effusion. Mitral Valve: The mitral valve is normal in structure. No evidence of mitral valve regurgitation. No evidence of mitral valve stenosis. Tricuspid Valve: The tricuspid valve is normal in structure. Tricuspid valve regurgitation is mild . No evidence of tricuspid stenosis. Aortic Valve: The aortic valve is calcified. There is mild calcification of the aortic valve. There is mild thickening of the aortic valve. Aortic valve regurgitation is not visualized. No aortic stenosis is present. Pulmonic Valve: The pulmonic valve was normal in structure. Pulmonic valve regurgitation is not visualized. No evidence of pulmonic stenosis. Aorta: The aortic root is normal in size and structure. Venous: The inferior vena cava is normal in size with greater than 50% respiratory variability, suggesting right atrial pressure of 3 mmHg. IAS/Shunts: No atrial level shunt detected by color flow Doppler.  LEFT VENTRICLE PLAX 2D LVIDd:         4.55 cm  Diastology LVIDs:         3.93 cm  LV e' medial:    4.68 cm/s LV PW:         1.30 cm  LV E/e' medial:  18.7 LV IVS:        1.36 cm  LV e' lateral:   3.81 cm/s LVOT diam:     1.90 cm  LV E/e' lateral: 22.9 LV SV:         39 LV SV Index:   20 LVOT Area:     2.84 cm  RIGHT VENTRICLE RV Basal diam:  3.44 cm RV S  prime:     10.60 cm/s TAPSE (M-mode): 3.6 cm LEFT ATRIUM         Index LA diam:    5.00 cm 2.57 cm/m  AORTIC VALVE LVOT Vmax:  78.30 cm/s LVOT Vmean:  58.600 cm/s LVOT VTI:    0.137 m  AORTA Ao Root diam: 3.10 cm MITRAL VALVE               TRICUSPID VALVE MV Area (PHT): 4.31 cm    TR Peak grad:   30.9 mmHg MV Decel Time: 176 msec    TR Vmax:        278.00 cm/s MV E velocity: 87.30 cm/s MV A velocity: 23.80 cm/s  SHUNTS MV E/A ratio:  3.67        Systemic VTI:  0.14 m                            Systemic Diam: 1.90 cm Ena Dawley MD Electronically signed by Ena Dawley MD Signature Date/Time: 12/31/2020/3:52:50 PM    Final     Anti-infectives: Anti-infectives (From admission, onward)   Start     Dose/Rate Route Frequency Ordered Stop   12/31/20 1200  piperacillin-tazobactam (ZOSYN) IVPB 3.375 g        3.375 g 12.5 mL/hr over 240 Minutes Intravenous Every 8 hours 12/31/20 0759     12/31/20 0530  piperacillin-tazobactam (ZOSYN) IVPB 3.375 g        3.375 g 100 mL/hr over 30 Minutes Intravenous  Once 12/31/20 0527 12/31/20 0659       Assessment/Plan HTN Atrial fibrillation -on Eliquis, hold currently OSA -patient feels as if she does not have this and does not wear CPAP BLE edema - echo with EF 40-45%, per primary   Acute appendicitis The patient is noted to have acute appendicitis on her CT scan which has been reviewed.  She may have a very small appendicolith noted but it is difficult to tell. Patient's main risk from a cardiac standpoint is increased age, ECHO with EF of 40-45%. Patient still ttp in RLQ. She would like to either proceed with surgery or go home on PO antibiotics. Discussed with Dr. Zenia Resides who is agreeable to proceed with surgery today.   FEN -NPO/IVF VTE -heparin, Eliquis on hold ID -Zosyn  LOS: 1 day    Norm Parcel , [facility] 01/01/2021, 9:49 AM Please see Amion for pager number during day hours 7:00am-4:30pm

## 2021-01-01 NOTE — Progress Notes (Addendum)
PROGRESS NOTE    Pamela Moody  NID:[id number] DOB: 1921/12/30 DOA: 12/31/2020 PCP: System, Provider Not In  Brief Narrative:  very pleasant 87 year old female with a history of permanent atrial fibrillation on Eliquis, hypertension, diverticulitis, fall risk, LBBB, cardiomyopathy who presented to the ED with worsening RLQ and RUQ abdominal pain x 3 days. Pain was gradual in onset and constant without any relieving factors. Worse when moving her right leg when trying to get in bed. Patient was concerned for for appendicitis which prompted her to come to the ED. Most recent Eliquis dose was last night.  Per EMS, patient had an SpO2 88% on room air and was placed on 2 L/min. No nausea, vomiting, fever, shortness of breath, cough.  States that she has had chronic lower extremity edema most notably in her right leg after having multiple varicose vein surgeries in that leg.    ED Course: Afebrile, tachycardic, tachypneic,  SpO2 90% with improvement to 95% on 2->4L/min. Notable Labs: Na 137, K 4.8, Glucose 120, BUN 16, Cr 0.95, Lipase 28, AST 26, ALT 9, T bili 2.2, Lactic acid 1.5->0.8, WBC 13.6, Hb 15.5, COVID 19 negative. Notable Imaging: CXR - coarse patchy and intersitial opacity in mid to lower lungs could reflect atelectasis, edema or infection. CT abd/pelv w contrast - acute suppurative appendicitis with no discrete perforation or abscess. Patient received Zosyn, 1 L NS bolus, magnesium. General surgery was consulted by the ED provider.   Assessment & Plan:   Principal Problem:   Appendicitis Active Problems:   Atrial fibrillation, chronic (HCC)   LBBB (left bundle branch block)   Respiratory failure (HCC)   Aortic atherosclerosis (HCC)   Essential hypertension  #1 sepsis present on admission secondary to acute appendicitis.  She met criteria for sepsis on admission with tachypnea tachycardia leukocytosis and appendicitis. She is started on Zosyn. Seen by general surgery and  cardiology. She wants to have surgery done, she knows she is at high risk, she tells me if she were to die on the table it is okay with her as she is right with God and she is ready to go. Planning for OR today. Incentive spirometer at bedside labs in the morning.  #2 permanent atrial fibrillation with left bundle branch block was on Eliquis prior to admission on hold for surgery.  Rate controlled.  We will add Lopressor as needed.  #3 history of essential hypertension  #4 chronic systolic heart failure with EF 40 to 45%.  Compensated.  Appreciate cardiology input.      Estimated body mass index is 40.41 kg/m as calculated from the following:   Height as of this encounter: _0  (1.549 m).   Weight as of this encounter: 97 kg.  DVT prophylaxis: Heparin Code Status: DO NOT RESUSCITATE Family Communication: None at bedside, called the number available patient's son Lennette Bihari apparently the number is not correct Disposition Plan:  Status is: Inpatient  Dispo: The patient is from: Home              Anticipated d/c is to: Home              Patient currently is not medically stable to d/c.   Difficult to place patient no     Consultants: General surgery and cardiology   Procedures: None Antimicrobials: Zosyn  Subjective: Patient resting in bed awake alert very pleasant young lady in no acute distress complaining of abdominal pain mainly on the right side of her abdomen  no nausea vomiting Requesting to have surgery done and go home Chest x-ray coarse patchy and interstitial opacity in the mid to lower lungs could reflect atelectasis edema or infection.  Objective: Vitals:   12/31/20 1610 12/31/20 2004 01/01/21 0005 01/01/21 0445  BP: (!) 135/96 (!) 145/91 133/89 119/69  Pulse: 85 95 (!) 105 99  Resp: _0 Temp: 98 F (36.7 C) (!) 97.5 F (36.4 C) (!) 97.5 F (36.4 C) 97.6 F (36.4 C)  TempSrc: Oral Oral Oral Oral  SpO2: 93% 93% 90% 90%  Weight:      Height:         Intake/Output Summary (Last 24 hours) at 01/01/2021 1135 Last data filed at 01/01/2021 0263 Gross per 24 hour  Intake 100 ml  Output 500 ml  Net -400 ml   Filed Weights   12/31/20 0703  Weight: 97 kg    Examination:  General exam: Appears calm and comfortable  Respiratory system: Clear to auscultation. Respiratory effort normal. Cardiovascular system: S1 & S2 heard, RRR. No JVD, murmurs, rubs, gallops or clicks. No pedal edema. Gastrointestinal system: Abdomen is nondistended, soft and tender RLQ. No organomegaly or masses felt. Normal bowel sounds heard. Central nervous system: Alert and oriented. No focal neurological deficits. Extremities: Trace bilateral pitting edema Skin: No rashes, lesions or ulcers Psychiatry: Judgement and insight appear normal. Mood & affect appropriate.     Data Reviewed: I have personally reviewed following labs and imaging studies  CBC: Recent Labs  Lab 12/31/20 0300 01/01/21 0600  WBC 13.6* 9.4  NEUTROABS 11.0*  --   HGB 15.5* 13.8  HCT 48.2* 43.6  MCV 96.2 97.3  PLT 225 785   Basic Metabolic Panel: Recent Labs  Lab 12/31/20 0300 12/31/20 0558 01/01/21 0600  NA 137  --  138  K 4.8  --  3.9  CL 103  --  102  CO2 25  --  27  GLUCOSE 120*  --  90  BUN 16  --  19  CREATININE 0.95  --  0.99  CALCIUM 9.1  --  8.2*  MG  --  1.9  --    GFR: Estimated Creatinine Clearance: 35.4 mL/min (by C-G formula based on SCr of 0.99 mg/dL). Liver Function Tests: Recent Labs  Lab 12/31/20 0300  AST 26  ALT 9  ALKPHOS 56  BILITOT 2.2*  PROT 7.6  ALBUMIN 4.1   Recent Labs  Lab 12/31/20 0300  LIPASE 28   No results for input(s): AMMONIA in the last 168 hours. Coagulation Profile: No results for input(s): INR, PROTIME in the last 168 hours. Cardiac Enzymes: No results for input(s): CKTOTAL, CKMB, CKMBINDEX, TROPONINI in the last 168 hours. BNP (last 3 results) No results for input(s): PROBNP in the last 8760 hours. HbA1C: No  results for input(s): HGBA1C in the last 72 hours. CBG: No results for input(s): GLUCAP in the last 168 hours. Lipid Profile: Recent Labs    12/31/20 0558  CHOL 183  HDL 65  LDLCALC 110*  TRIG 38  CHOLHDL 2.8   Thyroid Function Tests: No results for input(s): TSH, T4TOTAL, FREET4, T3FREE, THYROIDAB in the last 72 hours. Anemia Panel: No results for input(s): VITAMINB12, FOLATE, FERRITIN, TIBC, IRON, RETICCTPCT in the last 72 hours. Sepsis Labs: Recent Labs  Lab 12/31/20 0300 12/31/20 0558  LATICACIDVEN 1.5 0.8    Recent Results (from the past 240 hour(s))  Resp Panel by RT-PCR (Flu A&B, Covid) Urine, Clean Catch  Status: None   Collection Time: 12/31/20  3:00 AM   Specimen: Urine, Clean Catch; Nasopharyngeal(NP) swabs in vial transport medium  Result Value Ref Range Status   SARS Coronavirus 2 by RT PCR NEGATIVE NEGATIVE Final    Comment: (NOTE) SARS-CoV-2 target nucleic acids are NOT DETECTED.  The SARS-CoV-2 RNA is generally detectable in upper respiratory specimens during the acute phase of infection. The lowest concentration of SARS-CoV-2 viral copies this assay can detect is 138 copies/mL. A negative result does not preclude SARS-Cov-2 infection and should not be used as the sole basis for treatment or other patient management decisions. A negative result may occur with  improper specimen collection/handling, submission of specimen other than nasopharyngeal swab, presence of viral mutation(s) within the areas targeted by this assay, and inadequate number of viral copies(<138 copies/mL). A negative result must be combined with clinical observations, patient history, and epidemiological information. The expected result is Negative.  Fact Sheet for Patients:  EntrepreneurPulse.com.au  Fact Sheet for Healthcare Providers:  IncredibleEmployment.be  This test is no t yet approved or cleared by the [place] FDA and  has  been authorized for detection and/or diagnosis of SARS-CoV-2 by FDA under an Emergency Use Authorization (EUA). This EUA will remain  in effect (meaning this test can be used) for the duration of the COVID-19 declaration under Section 564(b)(1) of the Act, 21 U.S.C.section 360bbb-3(b)(1), unless the authorization is terminated  or revoked sooner.       Influenza A by PCR NEGATIVE NEGATIVE Final   Influenza B by PCR NEGATIVE NEGATIVE Final    Comment: (NOTE) The Xpert Xpress SARS-CoV-2/FLU/RSV plus assay is intended as an aid in the diagnosis of influenza from Nasopharyngeal swab specimens and should not be used as a sole basis for treatment. Nasal washings and aspirates are unacceptable for Xpert Xpress SARS-CoV-2/FLU/RSV testing.  Fact Sheet for Patients: EntrepreneurPulse.com.au  Fact Sheet for Healthcare Providers: IncredibleEmployment.be  This test is not yet approved or cleared by the [place] FDA and has been authorized for detection and/or diagnosis of SARS-CoV-2 by FDA under an Emergency Use Authorization (EUA). This EUA will remain in effect (meaning this test can be used) for the duration of the COVID-19 declaration under Section 564(b)(1) of the Act, 21 U.S.C. section 360bbb-3(b)(1), unless the authorization is terminated or revoked.  Performed at [facility], [place] [address] [place], [place] [address]   MRSA PCR Screening     Status: None   Collection Time: 12/31/20  6:38 PM   Specimen: Nasopharyngeal  Result Value Ref Range Status   MRSA by PCR NEGATIVE NEGATIVE Final    Comment:        The GeneXpert MRSA Assay (FDA approved for NASAL specimens only), is one component of a comprehensive MRSA colonization surveillance program. It is not intended to diagnose MRSA infection nor to guide or monitor treatment for MRSA infections. Performed at [facility], [place] [address]  [place], [place] [address]          Radiology Studies: CT ABDOMEN PELVIS W CONTRAST  Result Date: 12/31/2020 CLINICAL DATA:  Right lower quadrant pain. EXAM: CT ABDOMEN AND PELVIS WITH CONTRAST TECHNIQUE: Multidetector CT imaging of the abdomen and pelvis was performed using the standard protocol following bolus administration of intravenous contrast. CONTRAST:  169m OMNIPAQUE IOHEXOL 300 MG/ML  SOLN COMPARISON:  None. FINDINGS: Lower chest:  No acute finding.  Aortic atherosclerosis. Hepatobiliary: Exophytic cysts from the inferior right liver measuring up to 2  cm where there is minimal peripheral calcification seen on coronal reformats.No evidence of biliary obstruction or stone. Pancreas: Generalized atrophy Spleen: Unremarkable. Adrenals/Urinary Tract: Negative adrenals. No hydronephrosis or stone. Unremarkable bladder. Stomach/Bowel: Thick walled appendix with mesoappendiceal fat stranding and 11 mm outer wall diameter. A tiny appendicolith could be present towards the base on reformats. Subtle lobulation and possible wall thinning present at the tip, but without abscess or extraluminal gas. The appendix is in expected location in the right lower quadrant. No bowel obstruction/ileus. Vascular/Lymphatic: Diffuse atheromatous changes, expected for age. No mass or adenopathy. Reproductive:Hysterectomy Other: No ascites or pneumoperitoneum. Musculoskeletal: Lumbar spine degeneration with exaggerated lumbar lordosis. T10 hemangioma. IMPRESSION: Acute suppurative appendicitis. There may be wall thinning towards the tip but no discrete perforation or abscess. Electronically Signed   By: Monte Fantasia M.D.   On: 12/31/2020 05:19   DG Chest Port 1 View  Result Date: 12/31/2020 CLINICAL DATA:  Cough, shortness of breath EXAM: PORTABLE CHEST 1 VIEW COMPARISON:  06/25/2010 FINDINGS: Some coarse patchy and interstitial opacity seen in the mid to lower lungs. Low volumes and atelectatic changes. Enlarged cardiac  silhouette though may be partially accentuated by portable technique. Calcified tortuous aorta is noted as well. No visible pneumothorax or layering effusion. Telemetry leads overlie the chest. No acute osseous abnormality or suspicious osseous lesion. Left greater than right degenerative changes in the shoulders. IMPRESSION: Coarse patchy and interstitial opacity in the mid to lower lungs, could reflect atelectasis, edema or infection. Enlarged cardiac silhouette, possibly accentuated by portable technique. Aortic Atherosclerosis (ICD10-I70.0). Electronically Signed   By: Lovena Le M.D.   On: 12/31/2020 01:29   ECHOCARDIOGRAM COMPLETE  Result Date: 12/31/2020    ECHOCARDIOGRAM REPORT   Patient Name:   ALISHEA BEAUDIN Date of Exam: 12/31/2020 Medical Rec #:  [id number]          Height:       61.0 in Accession #:    [id number]         Weight:       213.8 lb Date of Birth:  [birth year]           BSA:          1.943 m Patient Age:    38 years           BP:           126/68 mmHg Patient Gender: F                  HR:           81 bpm. Exam Location:  Inpatient Procedure: 2D Echo, Cardiac Doppler and Color Doppler Indications:    Dyspnea  History:        Patient has no prior history of Echocardiogram examinations.                 Cardiomegaly, Arrythmias:Atrial Fibrillation and LBBB,                 Signs/Symptoms:Abdominal pain, appendicitis and Dyspnea; Risk                 Factors:Hypertension and obesity.  Sonographer:    Dustin Flock Referring Phys: [id number] JARED E SEGAL  Sonographer Comments: Patient is morbidly obese. IMPRESSIONS  1. Left ventricular ejection fraction, by estimation, is 40 to 45%. The left ventricle has mildly decreased function. The left ventricle demonstrates global hypokinesis. There is moderate concentric left ventricular hypertrophy. Left ventricular diastolic function could not be evaluated.  2. Right ventricular systolic function is normal. The right ventricular size is normal.  There is normal pulmonary artery systolic pressure. The estimated right ventricular systolic pressure is 29.0 mmHg.  3. Left atrial size was moderately dilated.  4. Right atrial size was mildly dilated.  5. The mitral valve is normal in structure. No evidence of mitral valve regurgitation. No evidence of mitral stenosis.  6. The aortic valve is calcified. There is mild calcification of the aortic valve. There is mild thickening of the aortic valve. Aortic valve regurgitation is not visualized. No aortic stenosis is present.  7. The inferior vena cava is normal in size with greater than 50% respiratory variability, suggesting right atrial pressure of 3 mmHg. FINDINGS  Left Ventricle: Left ventricular ejection fraction, by estimation, is 40 to 45%. The left ventricle has mildly decreased function. The left ventricle demonstrates global hypokinesis. The left ventricular internal cavity size was normal in size. There is  moderate concentric left ventricular hypertrophy. Abnormal (paradoxical) septal motion, consistent with left bundle branch block. Left ventricular diastolic function could not be evaluated due to atrial fibrillation. Left ventricular diastolic function could not be evaluated. Right Ventricle: The right ventricular size is normal. No increase in right ventricular wall thickness. Right ventricular systolic function is normal. There is normal pulmonary artery systolic pressure. The tricuspid regurgitant velocity is 2.78 m/s, and  with an assumed right atrial pressure of 3 mmHg, the estimated right ventricular systolic pressure is 21.1 mmHg. Left Atrium: Left atrial size was moderately dilated. Right Atrium: Right atrial size was mildly dilated. Pericardium: There is no evidence of pericardial effusion. Mitral Valve: The mitral valve is normal in structure. No evidence of mitral valve regurgitation. No evidence of mitral valve stenosis. Tricuspid Valve: The tricuspid valve is normal in structure. Tricuspid  valve regurgitation is mild . No evidence of tricuspid stenosis. Aortic Valve: The aortic valve is calcified. There is mild calcification of the aortic valve. There is mild thickening of the aortic valve. Aortic valve regurgitation is not visualized. No aortic stenosis is present. Pulmonic Valve: The pulmonic valve was normal in structure. Pulmonic valve regurgitation is not visualized. No evidence of pulmonic stenosis. Aorta: The aortic root is normal in size and structure. Venous: The inferior vena cava is normal in size with greater than 50% respiratory variability, suggesting right atrial pressure of 3 mmHg. IAS/Shunts: No atrial level shunt detected by color flow Doppler.  LEFT VENTRICLE PLAX 2D LVIDd:         4.55 cm  Diastology LVIDs:         3.93 cm  LV e' medial:    4.68 cm/s LV PW:         1.30 cm  LV E/e' medial:  18.7 LV IVS:        1.36 cm  LV e' lateral:   3.81 cm/s LVOT diam:     1.90 cm  LV E/e' lateral: 22.9 LV SV:         39 LV SV Index:   20 LVOT Area:     2.84 cm  RIGHT VENTRICLE RV Basal diam:  3.44 cm RV S prime:     10.60 cm/s TAPSE (M-mode): 3.6 cm LEFT ATRIUM         Index LA diam:    5.00 cm 2.57 cm/m  AORTIC VALVE LVOT Vmax:   78.30 cm/s LVOT Vmean:  58.600 cm/s LVOT VTI:    0.137 m  AORTA Ao Root diam: 3.10 cm MITRAL VALVE  TRICUSPID VALVE MV Area (PHT): 4.31 cm    TR Peak grad:   30.9 mmHg MV Decel Time: 176 msec    TR Vmax:        278.00 cm/s MV E velocity: 87.30 cm/s MV A velocity: 23.80 cm/s  SHUNTS MV E/A ratio:  3.67        Systemic VTI:  0.14 m                            Systemic Diam: 1.90 cm Ena Dawley MD Electronically signed by Ena Dawley MD Signature Date/Time: 12/31/2020/3:52:50 PM    Final         Scheduled Meds: . acetaminophen  1,000 mg Oral Once  . heparin  5,000 Units Subcutaneous Q8H  .  morphine injection  2 mg Intravenous Once  . ondansetron (ZOFRAN) IV  4 mg Intravenous Once  . sodium chloride flush  3 mL Intravenous Q12H    Continuous Infusions: . piperacillin-tazobactam (ZOSYN)  IV 3.375 g (01/01/21 0430)     LOS: 1 day   Georgette Shell, MD 01/01/2021, 11:35 AM

## 2021-01-01 NOTE — Anesthesia Postprocedure Evaluation (Signed)
Anesthesia Post Note  Patient: Pamela Moody  Procedure(s) Performed: APPENDECTOMY LAPAROSCOPIC (N/A Abdomen)     Patient location during evaluation: PACU Anesthesia Type: General Level of consciousness: awake and alert Pain management: pain level controlled Vital Signs Assessment: post-procedure vital signs reviewed and stable Respiratory status: spontaneous breathing, nonlabored ventilation, respiratory function stable and patient connected to nasal cannula oxygen Cardiovascular status: blood pressure returned to baseline and stable Postop Assessment: no apparent nausea or vomiting Anesthetic complications: no   No complications documented.  Last Vitals:  Vitals:   01/01/21 1538 01/01/21 1545  BP: (!) 160/83 (!) 146/87  Pulse: 82 85  Resp: 17 15  Temp:    SpO2: 94% 92%    Last Pain:  Vitals:   01/01/21 1545  TempSrc:   PainSc: 0-No pain                 Andrew Soria,W. EDMOND

## 2021-01-01 NOTE — Anesthesia Procedure Notes (Signed)
Procedure Name: Intubation Date/Time: 01/01/2021 2:15 PM Performed by: Florene Route, CRNA Patient Re-evaluated:Patient Re-evaluated prior to induction Oxygen Delivery Method: Circle system utilized Preoxygenation: Pre-oxygenation with 100% oxygen Induction Type: IV induction Ventilation: Mask ventilation without difficulty and Oral airway inserted - appropriate to patient size Laryngoscope Size: Hyacinth Meeker and 2 Grade View: Grade I Tube type: Oral Tube size: 7.5 mm Number of attempts: 1 Airway Equipment and Method: Stylet Placement Confirmation: ETT inserted through vocal cords under direct vision,  positive ETCO2 and breath sounds checked- equal and bilateral Secured at: 20 cm Tube secured with: Tape Dental Injury: Teeth and Oropharynx as per pre-operative assessment

## 2021-01-01 NOTE — Discharge Instructions (Signed)
[facility] ° °Please arrive at least 30 min before your appointment to complete your check in paperwork.  If you are unable to arrive 30 min prior to your appointment time we may have to cancel or reschedule you. °LAPAROSCOPIC SURGERY: POST OP INSTRUCTIONS °Always review your discharge instruction sheet given to you by the facility where your surgery was performed. °IF YOU HAVE DISABILITY OR FAMILY LEAVE FORMS, YOU MUST BRING THEM TO THE OFFICE FOR PROCESSING.   °DO NOT GIVE THEM TO YOUR DOCTOR. ° °PAIN CONTROL ° °1. First take acetaminophen (Tylenol) AND/or ibuprofen (Advil) to control your pain after surgery.  Follow directions on package.  Taking acetaminophen (Tylenol) and/or ibuprofen (Advil) regularly after surgery will help to control your pain and lower the amount of prescription pain medication you may need.  You should not take more than 4,000 mg (4 grams) of acetaminophen (Tylenol) in 24 hours.  You should not take ibuprofen (Advil), aleve, motrin, naprosyn or other NSAIDS if you have a history of stomach ulcers or chronic kidney disease.  °2. A prescription for pain medication may be given to you upon discharge.  Take your pain medication as prescribed, if you still have uncontrolled pain after taking acetaminophen (Tylenol) or ibuprofen (Advil). °3. Use ice packs to help control pain. °4. If you need a refill on your pain medication, please contact your pharmacy.  They will contact our office to request authorization. Prescriptions will not be filled after 5pm or on week-ends. ° °HOME MEDICATIONS °5. Take your usually prescribed medications unless otherwise directed. ° °DIET °6. You should follow a light diet the first few days after arrival home.  Be sure to include lots of fluids daily. Avoid fatty, fried foods.  ° °CONSTIPATION °7. It is common to experience some constipation after surgery and if you are taking pain medication.  Increasing fluid intake and taking a stool  softener (such as Colace) will usually help or prevent this problem from occurring.  A mild laxative (Milk of Magnesia or Miralax) should be taken according to package instructions if there are no bowel movements after 48 hours. ° °WOUND/INCISION CARE °8. Most patients will experience some swelling and bruising in the area of the incisions.  Ice packs will help.  Swelling and bruising can take several days to resolve.  °9. Unless discharge instructions indicate otherwise, follow guidelines below  °a. STERI-STRIPS - you may remove your outer bandages 48 hours after surgery, and you may shower at that time.  You have steri-strips (small skin tapes) in place directly over the incision.  These strips should be left on the skin for 7-10 days.   °b. DERMABOND/SKIN GLUE - you may shower in 24 hours.  The glue will flake off over the next 2-3 weeks. °10. Any sutures or staples will be removed at the office during your follow-up visit. ° °ACTIVITIES °11. You may resume regular (light) daily activities beginning the next day--such as daily self-care, walking, climbing stairs--gradually increasing activities as tolerated.  You may have sexual intercourse when it is comfortable.  Refrain from any heavy lifting or straining until approved by your doctor. °a. You may drive when you are no longer taking prescription pain medication, you can comfortably wear a seatbelt, and you can safely maneuver your car and apply brakes. ° °FOLLOW-UP °12. You should see your doctor in the office for a follow-up appointment approximately 2-3 weeks after your surgery.  You should have been given your post-op/follow-up appointment when   your surgery was scheduled.  If you did not receive a post-op/follow-up appointment, make sure that you call for this appointment within a day or two after you arrive home to insure a convenient appointment time. ° ° °WHEN TO CALL YOUR DOCTOR: °1. Fever over 101.0 °2. Inability to urinate °3. Continued bleeding from  incision. °4. Increased pain, redness, or drainage from the incision. °5. Increasing abdominal pain ° °The clinic staff is available to answer your questions during regular business hours.  Please don’t hesitate to call and ask to speak to one of the nurses for clinical concerns.  If you have a medical emergency, go to the nearest emergency room or call 911.  A surgeon from [facility] is always on call at the hospital. °[address], [address], [place], [place]  [address] ? [address] [address], [place], [place]   [address] °[phone] ? [phone] ? FAX [phone] ° °

## 2021-01-01 NOTE — Transfer of Care (Signed)
Immediate Anesthesia Transfer of Care Note  Patient: Pamela Moody  Procedure(s) Performed: APPENDECTOMY LAPAROSCOPIC (N/A Abdomen)  Patient Location: PACU  Anesthesia Type:General  Level of Consciousness: awake and alert   Airway & Oxygen Therapy: Patient Spontanous Breathing and Patient connected to face mask oxygen  Post-op Assessment: Report given to RN and Post -op Vital signs reviewed and stable  Post vital signs: Reviewed and stable  Last Vitals:  Vitals Value Taken Time  BP 180/106 01/01/21 1530  Temp    Pulse 79 01/01/21 1532  Resp 19 01/01/21 1532  SpO2 94 % 01/01/21 1532  Vitals shown include unvalidated device data.  Last Pain:  Vitals:   01/01/21 1239  TempSrc: Oral  PainSc:          Complications: No complications documented.

## 2021-01-02 ENCOUNTER — Encounter (HOSPITAL_COMMUNITY): Payer: Self-pay | Admitting: Surgery

## 2021-01-02 DIAGNOSIS — I5022 Chronic systolic (congestive) heart failure: Secondary | ICD-10-CM

## 2021-01-02 DIAGNOSIS — I447 Left bundle-branch block, unspecified: Secondary | ICD-10-CM | POA: Diagnosis not present

## 2021-01-02 DIAGNOSIS — I482 Chronic atrial fibrillation, unspecified: Secondary | ICD-10-CM | POA: Diagnosis not present

## 2021-01-02 LAB — CBC
HCT: 45.8 % (ref 36.0–46.0)
Hemoglobin: 14.5 g/dL (ref 12.0–15.0)
MCH: 30.9 pg (ref 26.0–34.0)
MCHC: 31.7 g/dL (ref 30.0–36.0)
MCV: 97.7 fL (ref 80.0–100.0)
Platelets: 209 10*3/uL (ref 150–400)
RBC: 4.69 MIL/uL (ref 3.87–5.11)
RDW: 13.8 % (ref 11.5–15.5)
WBC: 11.8 10*3/uL — ABNORMAL HIGH (ref 4.0–10.5)
nRBC: 0 % (ref 0.0–0.2)

## 2021-01-02 MED ORDER — LOSARTAN POTASSIUM 50 MG PO TABS
25.0000 mg | ORAL_TABLET | Freq: Every day | ORAL | Status: DC
  Administered 2021-01-02: 25 mg via ORAL
  Filled 2021-01-02: qty 1

## 2021-01-02 MED ORDER — METOPROLOL SUCCINATE ER 25 MG PO TB24
25.0000 mg | ORAL_TABLET | Freq: Every day | ORAL | Status: DC
  Administered 2021-01-02: 25 mg via ORAL
  Filled 2021-01-02: qty 1

## 2021-01-02 MED ORDER — METOPROLOL SUCCINATE ER 25 MG PO TB24
25.0000 mg | ORAL_TABLET | Freq: Every day | ORAL | 2 refills | Status: AC
Start: 2021-01-03 — End: ?

## 2021-01-02 MED ORDER — TELMISARTAN 40 MG PO TABS: 40.0000 mg | ORAL_TABLET | Freq: Every day | ORAL | 2 refills | Status: AC

## 2021-01-02 MED ORDER — FUROSEMIDE 20 MG PO TABS: 20.0000 mg | ORAL_TABLET | Freq: Every day | ORAL | 11 refills | Status: AC | PRN

## 2021-01-02 MED ORDER — TRAMADOL HCL 50 MG PO TABS: 50.0000 mg | ORAL_TABLET | Freq: Four times a day (QID) | ORAL | 0 refills | Status: AC | PRN

## 2021-01-02 MED ORDER — LIP MEDEX EX OINT
TOPICAL_OINTMENT | CUTANEOUS | Status: AC
  Filled 2021-01-02: qty 7

## 2021-01-02 NOTE — Progress Notes (Signed)
1 Day Post-Op   Subjective/Chief Complaint: She reports that she is doing well and has no pain Tolerating po   Objective: Vital signs in last 24 hours: Temp:  [97.4 F (36.3 C)-98.4 F (36.9 C)] 97.8 F (36.6 C) (03/26 0622) Pulse Rate:  [79-112] 82 (03/26 0622) Resp:  [12-20] 20 (03/26 0622) BP: (122-180)/(59-114) 158/99 (03/26 0622) SpO2:  [84 %-99 %] 95 % (03/26 0622) Last BM Date: 12/30/20  Intake/Output from previous day: 03/25 0701 - 03/26 0700 In: 950 [I.V.:900; IV Piggyback:50] Out: 1625 [Urine:1600; Blood:25] Intake/Output this shift: No intake/output data recorded.  Exam: Awake and alert Abdomen soft, incisions clean  Lab Results:  Recent Labs    01/01/21 0600 01/02/21 0650  WBC 9.4 11.8*  HGB 13.8 14.5  HCT 43.6 45.8  PLT 187 209   BMET Recent Labs    12/31/20 0300 01/01/21 0600  NA 137 138  K 4.8 3.9  CL 103 102  CO2 25 27  GLUCOSE 120* 90  BUN 16 19  CREATININE 0.95 0.99  CALCIUM 9.1 8.2*   PT/INR No results for input(s): LABPROT, INR in the last 72 hours. ABG No results for input(s): PHART, HCO3 in the last 72 hours.  Invalid input(s): PCO2, PO2  Studies/Results: ECHOCARDIOGRAM COMPLETE  Result Date: 12/31/2020    ECHOCARDIOGRAM REPORT   Patient Name:   Pamela Moody Date of Exam: 12/31/2020 Medical Rec #:  [id number]          Height:       61.0 in Accession #:    [id number]         Weight:       213.8 lb Date of Birth:  [birth year]           BSA:          1.943 m Patient Age:    87 years           BP:           126/68 mmHg Patient Gender: F                  HR:           81 bpm. Exam Location:  Inpatient Procedure: 2D Echo, Cardiac Doppler and Color Doppler Indications:    Dyspnea  History:        Patient has no prior history of Echocardiogram examinations.                 Cardiomegaly, Arrythmias:Atrial Fibrillation and LBBB,                 Signs/Symptoms:Abdominal pain, appendicitis and Dyspnea; Risk                  Factors:Hypertension and obesity.  Sonographer:    Lavenia Atlas Referring Phys: [id number] JARED E SEGAL  Sonographer Comments: Patient is morbidly obese. IMPRESSIONS  1. Left ventricular ejection fraction, by estimation, is 40 to 45%. The left ventricle has mildly decreased function. The left ventricle demonstrates global hypokinesis. There is moderate concentric left ventricular hypertrophy. Left ventricular diastolic function could not be evaluated.  2. Right ventricular systolic function is normal. The right ventricular size is normal. There is normal pulmonary artery systolic pressure. The estimated right ventricular systolic pressure is 33.9 mmHg.  3. Left atrial size was moderately dilated.  4. Right atrial size was mildly dilated.  5. The mitral valve is normal in structure. No evidence of mitral valve regurgitation. No evidence of mitral stenosis.  6. The aortic valve is calcified. There is mild calcification of the aortic valve. There is mild thickening of the aortic valve. Aortic valve regurgitation is not visualized. No aortic stenosis is present.  7. The inferior vena cava is normal in size with greater than 50% respiratory variability, suggesting right atrial pressure of 3 mmHg. FINDINGS  Left Ventricle: Left ventricular ejection fraction, by estimation, is 40 to 45%. The left ventricle has mildly decreased function. The left ventricle demonstrates global hypokinesis. The left ventricular internal cavity size was normal in size. There is  moderate concentric left ventricular hypertrophy. Abnormal (paradoxical) septal motion, consistent with left bundle branch block. Left ventricular diastolic function could not be evaluated due to atrial fibrillation. Left ventricular diastolic function could not be evaluated. Right Ventricle: The right ventricular size is normal. No increase in right ventricular wall thickness. Right ventricular systolic function is normal. There is normal pulmonary artery systolic  pressure. The tricuspid regurgitant velocity is 2.78 m/s, and  with an assumed right atrial pressure of 3 mmHg, the estimated right ventricular systolic pressure is 33.9 mmHg. Left Atrium: Left atrial size was moderately dilated. Right Atrium: Right atrial size was mildly dilated. Pericardium: There is no evidence of pericardial effusion. Mitral Valve: The mitral valve is normal in structure. No evidence of mitral valve regurgitation. No evidence of mitral valve stenosis. Tricuspid Valve: The tricuspid valve is normal in structure. Tricuspid valve regurgitation is mild . No evidence of tricuspid stenosis. Aortic Valve: The aortic valve is calcified. There is mild calcification of the aortic valve. There is mild thickening of the aortic valve. Aortic valve regurgitation is not visualized. No aortic stenosis is present. Pulmonic Valve: The pulmonic valve was normal in structure. Pulmonic valve regurgitation is not visualized. No evidence of pulmonic stenosis. Aorta: The aortic root is normal in size and structure. Venous: The inferior vena cava is normal in size with greater than 50% respiratory variability, suggesting right atrial pressure of 3 mmHg. IAS/Shunts: No atrial level shunt detected by color flow Doppler.  LEFT VENTRICLE PLAX 2D LVIDd:         4.55 cm  Diastology LVIDs:         3.93 cm  LV e' medial:    4.68 cm/s LV PW:         1.30 cm  LV E/e' medial:  18.7 LV IVS:        1.36 cm  LV e' lateral:   3.81 cm/s LVOT diam:     1.90 cm  LV E/e' lateral: 22.9 LV SV:         39 LV SV Index:   20 LVOT Area:     2.84 cm  RIGHT VENTRICLE RV Basal diam:  3.44 cm RV S prime:     10.60 cm/s TAPSE (M-mode): 3.6 cm LEFT ATRIUM         Index LA diam:    5.00 cm 2.57 cm/m  AORTIC VALVE LVOT Vmax:   78.30 cm/s LVOT Vmean:  58.600 cm/s LVOT VTI:    0.137 m  AORTA Ao Root diam: 3.10 cm MITRAL VALVE               TRICUSPID VALVE MV Area (PHT): 4.31 cm    TR Peak grad:   30.9 mmHg MV Decel Time: 176 msec    TR Vmax:         278.00 cm/s MV E velocity: 87.30 cm/s MV A velocity: 23.80 cm/s  SHUNTS MV E/A ratio:  3.67        Systemic VTI:  0.14 m                            Systemic Diam: 1.90 cm Tobias Alexander MD Electronically signed by Tobias Alexander MD Signature Date/Time: 12/31/2020/3:52:50 PM    Final     Anti-infectives: Anti-infectives (From admission, onward)   Start     Dose/Rate Route Frequency Ordered Stop   12/31/20 1200  piperacillin-tazobactam (ZOSYN) IVPB 3.375 g  Status:  Discontinued        3.375 g 12.5 mL/hr over 240 Minutes Intravenous Every 8 hours 12/31/20 0759 01/01/21 1520   12/31/20 0530  piperacillin-tazobactam (ZOSYN) IVPB 3.375 g        3.375 g 100 mL/hr over 30 Minutes Intravenous  Once 12/31/20 0527 12/31/20 0659      Assessment/Plan: s/p Procedure(s): APPENDECTOMY LAPAROSCOPIC (N/A)  Doing well post op Ok to resume Eliquis from a surgery standpoint and ok to discharge back to her facility   LOS: 2 days    Pamela Moody 01/02/2021

## 2021-01-02 NOTE — Discharge Summary (Signed)
Physician Discharge Summary  Pamela Moody:[id number] DOB: Mar 23, 1922 DOA: 12/31/2020  PCP: System, Provider Not In  Admit date: 12/31/2020 Discharge date: 01/02/2021  Admitted From[facility] Disposition: Independent living  Recommendations for Outpatient Follow-up:  1. Follow up with PCP in 1-2 weeks 2. Please obtain BMP/CBC in one week 3. Follow-up with general surgery  Home Health yes Equipment/Devices: None  Discharge Condition stable CODE STATUS DNR diet recommendation: Cardiac diet Brief/Interim Summary:very pleasant87 year old female with a history of permanent atrial fibrillation on Eliquis, hypertension, diverticulitis, fall risk, LBBB, cardiomyopathy who presented to the ED with worsening RLQ and RUQ abdominal pain x 3 days. Pain was gradual in onset and constant without any relieving factors. Worse when moving her right legwhen trying to get in bed. Patient was concerned for for appendicitis which prompted her to come to the ED.Most recent Eliquis dose was last night.Per EMS, patient had an SpO2 88% on room air and was placed on 2 L/min. No nausea, vomiting, fever, shortness of breath, cough. States that she has had chronic lower extremity edema most notably in her right leg after having multiple varicose vein surgeries in that leg.   ED Course:Afebrile, tachycardic, tachypneic,SpO2 90% with improvement to 95% on 2->4L/min. Notable Labs:Na 137, K 4.8, Glucose 120, BUN 16, Cr 0.95, Lipase 28, AST 26, ALT 9, T bili 2.2, Lactic acid 1.5->0.8, WBC 13.6, Hb 15.5, COVID 19 negative. Notable Imaging:CXR - coarse patchy and intersitial opacity in mid to lower lungs could reflect atelectasis, edema or infection. CT abd/pelv w contrast - acute suppurative appendicitis with no discrete perforation or abscess. Patient receivedZosyn, 1 L NS bolus, magnesium. General surgery was consulted by the ED provider.  Discharge Diagnoses:  Principal  Problem:   Appendicitis Active Problems:   Atrial fibrillation, chronic (HCC)   LBBB (left bundle branch block)   Respiratory failure (HCC)   Aortic atherosclerosis ([place])   Essential hypertension     #1 sepsis present on admission secondary to acute appendicitis.  She met criteria for sepsis on admission with tachypnea tachycardia leukocytosis and appendicitis.  Treated with Zosyn.  She was taken to the OR for laparoscopic appendectomy and she did very well postop.  She was seen by cardiology and general surgery. She tolerated a diet with no nausea vomiting or increased pain.  #2 permanent atrial fibrillation with left bundle branch block-restart Eliquis.  She has been started on an ACE inhibitor and metoprolol during this hospital stay which I will discharge her on both.  I also gave her a prescription for Lasix 20 mg as needed.    #3 history of essential hypertension continue beta-blocker and ACE inhibitor.  #4 chronic systolic heart failure with EF 40 to 45%.  Compensated   Estimated body mass index is 40.41 kg/m as calculated from the following:   Height as of this encounter: 5' 1"  (1.549 m).   Weight as of this encounter: 97 kg.  Discharge Instructions  Discharge Instructions    Diet - low sodium heart healthy   Complete by: As directed    Diet - low sodium heart healthy   Complete by: As directed    Increase activity slowly   Complete by: As directed    Increase activity slowly   Complete by: As directed      Allergies as of 01/02/2021      Reactions   Diltiazem    Other reaction(s): Other (See Comments) Unknown  Unknown    Sulfa Antibiotics  Pt states causes swelling   Morphine Nausea And Vomiting      Medication List    STOP taking these medications   spironolactone 25 MG tablet Commonly known as: ALDACTONE     TAKE these medications   acetaminophen 500 MG tablet Commonly known as: TYLENOL Take 500 mg by mouth every 6 (six) hours as needed for mild  pain, fever or headache.   cholecalciferol 25 MCG (1000 UNIT) tablet Commonly known as: VITAMIN D3 Take 1,000 Units by mouth daily.   Eliquis 5 MG Tabs tablet Generic drug: apixaban Take 5 mg by mouth 2 (two) times daily.   furosemide 20 MG tablet Commonly known as: Lasix Take 1 tablet (20 mg total) by mouth daily as needed.   metoprolol succinate 25 MG 24 hr tablet Commonly known as: TOPROL-XL Take 1 tablet (25 mg total) by mouth daily. Start taking on: January 03, 2021   Omega-3 1000 MG Caps Take 1 g by mouth daily.   polyvinyl alcohol 1.4 % ophthalmic solution Commonly known as: LIQUIFILM TEARS Place 1 drop into both eyes as needed for dry eyes.   PreserVision/Lutein Caps Take 1 tablet by mouth 2 (two) times daily.   telmisartan 40 MG tablet Commonly known as: MICARDIS Take 1 tablet (40 mg total) by mouth daily.   traMADol 50 MG tablet Commonly known as: ULTRAM Take 1 tablet (50 mg total) by mouth every 6 (six) hours as needed for moderate pain.   VITAMIN E PO Take 1 tablet by mouth daily.       Follow-up Information    [facility], [facility] [place] Follow up on 01/21/2021.   Specialty: General Surgery Why: 2 pm, arrive by 1:30pm for paperwork and check in process Contact information: [address] [place] [address] [phone]              Allergies  Allergen Reactions   Diltiazem     Other reaction(s): Other (See Comments) Unknown  Unknown     Sulfa Antibiotics     Pt states causes swelling   Morphine Nausea And Vomiting    Consultations: General surgery and cardiology Procedures/Studies: CT ABDOMEN PELVIS W CONTRAST  Result Date: 12/31/2020 CLINICAL DATA:  Right lower quadrant pain. EXAM: CT ABDOMEN AND PELVIS WITH CONTRAST TECHNIQUE: Multidetector CT imaging of the abdomen and pelvis was performed using the standard protocol following bolus administration of intravenous contrast. CONTRAST:  125m OMNIPAQUE IOHEXOL 300 MG/ML   SOLN COMPARISON:  None. FINDINGS: Lower chest:  No acute finding.  Aortic atherosclerosis. Hepatobiliary: Exophytic cysts from the inferior right liver measuring up to 2 cm where there is minimal peripheral calcification seen on coronal reformats.No evidence of biliary obstruction or stone. Pancreas: Generalized atrophy Spleen: Unremarkable. Adrenals/Urinary Tract: Negative adrenals. No hydronephrosis or stone. Unremarkable bladder. Stomach/Bowel: Thick walled appendix with mesoappendiceal fat stranding and 11 mm outer wall diameter. A tiny appendicolith could be present towards the base on reformats. Subtle lobulation and possible wall thinning present at the tip, but without abscess or extraluminal gas. The appendix is in expected location in the right lower quadrant. No bowel obstruction/ileus. Vascular/Lymphatic: Diffuse atheromatous changes, expected for age. No mass or adenopathy. Reproductive:Hysterectomy Other: No ascites or pneumoperitoneum. Musculoskeletal: Lumbar spine degeneration with exaggerated lumbar lordosis. T10 hemangioma. IMPRESSION: Acute suppurative appendicitis. There may be wall thinning towards the tip but no discrete perforation or abscess. Electronically Signed   By: JMonte FantasiaM.D.   On: 12/31/2020 05:19   [facility]  [facility]  Result Date: 12/31/2020 CLINICAL DATA:  Cough, shortness of breath EXAM: PORTABLE CHEST 1 VIEW COMPARISON:  06/25/2010 FINDINGS: Some coarse patchy and interstitial opacity seen in the mid to lower lungs. Low volumes and atelectatic changes. Enlarged cardiac silhouette though may be partially accentuated by portable technique. Calcified tortuous aorta is noted as well. No visible pneumothorax or layering effusion. Telemetry leads overlie the chest. No acute osseous abnormality or suspicious osseous lesion. Left greater than right degenerative changes in the shoulders. IMPRESSION: Coarse patchy and interstitial opacity in the mid to lower lungs, could  reflect atelectasis, edema or infection. Enlarged cardiac silhouette, possibly accentuated by portable technique. Aortic Atherosclerosis (ICD10-I70.0). Electronically Signed   By: Lovena Le M.D.   On: 12/31/2020 01:29   ECHOCARDIOGRAM COMPLETE  Result Date: 12/31/2020    ECHOCARDIOGRAM REPORT   Patient Name:   LORRIN BODNER Date of Exam: 12/31/2020 Medical Rec #:  [id number]          Height:       61.0 in Accession #:    [id number]         Weight:       213.8 lb Date of Birth:  13-Jul-1922           BSA:          1.943 m Patient Age:    47 years           BP:           126/68 mmHg Patient Gender: F                  HR:           81 bpm. Exam Location:  Inpatient Procedure: 2D Echo, Cardiac Doppler and Color Doppler Indications:    Dyspnea  History:        Patient has no prior history of Echocardiogram examinations.                 Cardiomegaly, Arrythmias:Atrial Fibrillation and LBBB,                 Signs/Symptoms:Abdominal pain, appendicitis and Dyspnea; Risk                 Factors:Hypertension and obesity.  Sonographer:    Dustin Flock Referring Phys: [id number] JARED E SEGAL  Sonographer Comments: Patient is morbidly obese. IMPRESSIONS  1. Left ventricular ejection fraction, by estimation, is 40 to 45%. The left ventricle has mildly decreased function. The left ventricle demonstrates global hypokinesis. There is moderate concentric left ventricular hypertrophy. Left ventricular diastolic function could not be evaluated.  2. Right ventricular systolic function is normal. The right ventricular size is normal. There is normal pulmonary artery systolic pressure. The estimated right ventricular systolic pressure is 62.2 mmHg.  3. Left atrial size was moderately dilated.  4. Right atrial size was mildly dilated.  5. The mitral valve is normal in structure. No evidence of mitral valve regurgitation. No evidence of mitral stenosis.  6. The aortic valve is calcified. There is mild calcification of the aortic  valve. There is mild thickening of the aortic valve. Aortic valve regurgitation is not visualized. No aortic stenosis is present.  7. The inferior vena cava is normal in size with greater than 50% respiratory variability, suggesting right atrial pressure of 3 mmHg. FINDINGS  Left Ventricle: Left ventricular ejection fraction, by estimation, is 40 to 45%. The left ventricle has mildly decreased function. The left ventricle demonstrates global hypokinesis.  The left ventricular internal cavity size was normal in size. There is  moderate concentric left ventricular hypertrophy. Abnormal (paradoxical) septal motion, consistent with left bundle branch block. Left ventricular diastolic function could not be evaluated due to atrial fibrillation. Left ventricular diastolic function could not be evaluated. Right Ventricle: The right ventricular size is normal. No increase in right ventricular wall thickness. Right ventricular systolic function is normal. There is normal pulmonary artery systolic pressure. The tricuspid regurgitant velocity is 2.78 m/s, and  with an assumed right atrial pressure of 3 mmHg, the estimated right ventricular systolic pressure is 62.8 mmHg. Left Atrium: Left atrial size was moderately dilated. Right Atrium: Right atrial size was mildly dilated. Pericardium: There is no evidence of pericardial effusion. Mitral Valve: The mitral valve is normal in structure. No evidence of mitral valve regurgitation. No evidence of mitral valve stenosis. Tricuspid Valve: The tricuspid valve is normal in structure. Tricuspid valve regurgitation is mild . No evidence of tricuspid stenosis. Aortic Valve: The aortic valve is calcified. There is mild calcification of the aortic valve. There is mild thickening of the aortic valve. Aortic valve regurgitation is not visualized. No aortic stenosis is present. Pulmonic Valve: The pulmonic valve was normal in structure. Pulmonic valve regurgitation is not visualized. No  evidence of pulmonic stenosis. Aorta: The aortic root is normal in size and structure. Venous: The inferior vena cava is normal in size with greater than 50% respiratory variability, suggesting right atrial pressure of 3 mmHg. IAS/Shunts: No atrial level shunt detected by color flow Doppler.  LEFT VENTRICLE PLAX 2D LVIDd:         4.55 cm  Diastology LVIDs:         3.93 cm  LV e' medial:    4.68 cm/s LV PW:         1.30 cm  LV E/e' medial:  18.7 LV IVS:        1.36 cm  LV e' lateral:   3.81 cm/s LVOT diam:     1.90 cm  LV E/e' lateral: 22.9 LV SV:         39 LV SV Index:   20 LVOT Area:     2.84 cm  RIGHT VENTRICLE RV Basal diam:  3.44 cm RV S prime:     10.60 cm/s TAPSE (M-mode): 3.6 cm LEFT ATRIUM         Index LA diam:    5.00 cm 2.57 cm/m  AORTIC VALVE LVOT Vmax:   78.30 cm/s LVOT Vmean:  58.600 cm/s LVOT VTI:    0.137 m  AORTA Ao Root diam: 3.10 cm MITRAL VALVE               TRICUSPID VALVE MV Area (PHT): 4.31 cm    TR Peak grad:   30.9 mmHg MV Decel Time: 176 msec    TR Vmax:        278.00 cm/s MV E velocity: 87.30 cm/s MV A velocity: 23.80 cm/s  SHUNTS MV E/A ratio:  3.67        Systemic VTI:  0.14 m                            Systemic Diam: 1.90 cm Ena Dawley MD Electronically signed by Ena Dawley MD Signature Date/Time: 12/31/2020/3:52:50 PM    Final     (Echo, Carotid, EGD, Colonoscopy, ERCP)    Subjective:  Patient resting in bed awake alert anxious to go  back to her facility she reports tolerating a diet does have some abdominal discomfort   Discharge Exam: Vitals:   01/01/21 2201 01/02/21 0622  BP:  (!) 158/99  Pulse:  82  Resp:  20  Temp: (!) 97.4 F (36.3 C) 97.8 F (36.6 C)  SpO2:  95%   Vitals:   01/01/21 1633 01/01/21 2111 01/01/21 2201 01/02/21 0622  BP: 132/83 (!) 144/108  (!) 158/99  Pulse: 99 100  82  Resp: 18 18  20   Temp: (!) 97.4 F (36.3 C)  (!) 97.4 F (36.3 C) 97.8 F (36.6 C)  TempSrc: Oral  Oral Oral  SpO2: 90% 96%  95%  Weight:      Height:         General: Pt is alert, awake, not in acute distress Cardiovascular: RRR, S1/S2 +, no rubs, no gallops Respiratory: CTA bilaterally, no wheezing, no rhonchi Abdominal: Soft, NT, ND, bowel sounds + some bruising noted around the umbilicus Extremities: no edema, no cyanosis    The results of significant diagnostics from this hospitalization (including imaging, microbiology, ancillary and laboratory) are listed below for reference.     Microbiology: Recent Results (from the past 240 hour(s))  Resp Panel by RT-PCR (Flu A&B, Covid) Urine, Clean Catch     Status: None   Collection Time: 12/31/20  3:00 AM   Specimen: Urine, Clean Catch; Nasopharyngeal(NP) swabs in vial transport medium  Result Value Ref Range Status   SARS Coronavirus 2 by RT PCR NEGATIVE NEGATIVE Final    Comment: (NOTE) SARS-CoV-2 target nucleic acids are NOT DETECTED.  The SARS-CoV-2 RNA is generally detectable in upper respiratory specimens during the acute phase of infection. The lowest concentration of SARS-CoV-2 viral copies this assay can detect is 138 copies/mL. A negative result does not preclude SARS-Cov-2 infection and should not be used as the sole basis for treatment or other patient management decisions. A negative result may occur with  improper specimen collection/handling, submission of specimen other than nasopharyngeal swab, presence of viral mutation(s) within the areas targeted by this assay, and inadequate number of viral copies(<138 copies/mL). A negative result must be combined with clinical observations, patient history, and epidemiological information. The expected result is Negative.  Fact Sheet for Patients:  EntrepreneurPulse.com.au  Fact Sheet for Healthcare Providers:  IncredibleEmployment.be  This test is no t yet approved or cleared by the [place] FDA and  has been authorized for detection and/or diagnosis of SARS-CoV-2 by FDA under an  Emergency Use Authorization (EUA). This EUA will remain  in effect (meaning this test can be used) for the duration of the COVID-19 declaration under Section 564(b)(1) of the Act, 21 U.S.C.section 360bbb-3(b)(1), unless the authorization is terminated  or revoked sooner.       Influenza A by PCR NEGATIVE NEGATIVE Final   Influenza B by PCR NEGATIVE NEGATIVE Final    Comment: (NOTE) The Xpert Xpress SARS-CoV-2/FLU/RSV plus assay is intended as an aid in the diagnosis of influenza from Nasopharyngeal swab specimens and should not be used as a sole basis for treatment. Nasal washings and aspirates are unacceptable for Xpert Xpress SARS-CoV-2/FLU/RSV testing.  Fact Sheet for Patients: EntrepreneurPulse.com.au  Fact Sheet for Healthcare Providers: IncredibleEmployment.be  This test is not yet approved or cleared by the [place] FDA and has been authorized for detection and/or diagnosis of SARS-CoV-2 by FDA under an Emergency Use Authorization (EUA). This EUA will remain in effect (meaning this test can be used) for the duration of  the COVID-19 declaration under Section 564(b)(1) of the Act, 21 U.S.C. section 360bbb-3(b)(1), unless the authorization is terminated or revoked.  Performed at [facility], [place] [address] [place], [place] [address]   MRSA PCR Screening     Status: None   Collection Time: 12/31/20  6:38 PM   Specimen: Nasopharyngeal  Result Value Ref Range Status   MRSA by PCR NEGATIVE NEGATIVE Final    Comment:        The GeneXpert MRSA Assay (FDA approved for NASAL specimens only), is one component of a comprehensive MRSA colonization surveillance program. It is not intended to diagnose MRSA infection nor to guide or monitor treatment for MRSA infections. Performed at [facility], [place] [address], [place], [place] [address]      Labs: BNP (last 3 results) No results for  input(s): BNP in the last 8760 hours. Basic Metabolic Panel: Recent Labs  Lab 12/31/20 0300 12/31/20 0558 01/01/21 0600  NA 137  --  138  K 4.8  --  3.9  CL 103  --  102  CO2 25  --  27  GLUCOSE 120*  --  90  BUN 16  --  19  CREATININE 0.95  --  0.99  CALCIUM 9.1  --  8.2*  MG  --  1.9  --    Liver Function Tests: Recent Labs  Lab 12/31/20 0300  AST 26  ALT 9  ALKPHOS 56  BILITOT 2.2*  PROT 7.6  ALBUMIN 4.1   Recent Labs  Lab 12/31/20 0300  LIPASE 28   No results for input(s): AMMONIA in the last 168 hours. CBC: Recent Labs  Lab 12/31/20 0300 01/01/21 0600 01/02/21 0650  WBC 13.6* 9.4 11.8*  NEUTROABS 11.0*  --   --   HGB 15.5* 13.8 14.5  HCT 48.2* 43.6 45.8  MCV 96.2 97.3 97.7  PLT 225 187 209   Cardiac Enzymes: No results for input(s): CKTOTAL, CKMB, CKMBINDEX, TROPONINI in the last 168 hours. BNP: Invalid input(s): POCBNP CBG: No results for input(s): GLUCAP in the last 168 hours. D-Dimer No results for input(s): DDIMER in the last 72 hours. Hgb A1c No results for input(s): HGBA1C in the last 72 hours. Lipid Profile Recent Labs    12/31/20 0558  CHOL 183  HDL 65  LDLCALC 110*  TRIG 38  CHOLHDL 2.8   Thyroid function studies No results for input(s): TSH, T4TOTAL, T3FREE, THYROIDAB in the last 72 hours.  Invalid input(s): FREET3 Anemia work up No results for input(s): VITAMINB12, FOLATE, FERRITIN, TIBC, IRON, RETICCTPCT in the last 72 hours. Urinalysis    Component Value Date/Time   COLORURINE STRAW (A) 12/31/2020 0300   APPEARANCEUR CLEAR 12/31/2020 0300   LABSPEC 1.004 (L) 12/31/2020 0300   PHURINE 7.0 12/31/2020 0300   GLUCOSEU NEGATIVE 12/31/2020 0300   HGBUR NEGATIVE 12/31/2020 0300   BILIRUBINUR NEGATIVE 12/31/2020 0300   KETONESUR NEGATIVE 12/31/2020 0300   PROTEINUR NEGATIVE 12/31/2020 0300   NITRITE NEGATIVE 12/31/2020 0300   LEUKOCYTESUR NEGATIVE 12/31/2020 0300   Sepsis Labs Invalid input(s): PROCALCITONIN,  WBC,   LACTICIDVEN Microbiology Recent Results (from the past 240 hour(s))  Resp Panel by RT-PCR (Flu A&B, Covid) Urine, Clean Catch     Status: None   Collection Time: 12/31/20  3:00 AM   Specimen: Urine, Clean Catch; Nasopharyngeal(NP) swabs in vial transport medium  Result Value Ref Range Status   SARS Coronavirus 2 by RT PCR NEGATIVE NEGATIVE Final    Comment: (  NOTE) SARS-CoV-2 target nucleic acids are NOT DETECTED.  The SARS-CoV-2 RNA is generally detectable in upper respiratory specimens during the acute phase of infection. The lowest concentration of SARS-CoV-2 viral copies this assay can detect is 138 copies/mL. A negative result does not preclude SARS-Cov-2 infection and should not be used as the sole basis for treatment or other patient management decisions. A negative result may occur with  improper specimen collection/handling, submission of specimen other than nasopharyngeal swab, presence of viral mutation(s) within the areas targeted by this assay, and inadequate number of viral copies(<138 copies/mL). A negative result must be combined with clinical observations, patient history, and epidemiological information. The expected result is Negative.  Fact Sheet for Patients:  EntrepreneurPulse.com.au  Fact Sheet for Healthcare Providers:  IncredibleEmployment.be  This test is no t yet approved or cleared by the [place] FDA and  has been authorized for detection and/or diagnosis of SARS-CoV-2 by FDA under an Emergency Use Authorization (EUA). This EUA will remain  in effect (meaning this test can be used) for the duration of the COVID-19 declaration under Section 564(b)(1) of the Act, 21 U.S.C.section 360bbb-3(b)(1), unless the authorization is terminated  or revoked sooner.       Influenza A by PCR NEGATIVE NEGATIVE Final   Influenza B by PCR NEGATIVE NEGATIVE Final    Comment: (NOTE) The Xpert Xpress SARS-CoV-2/FLU/RSV plus  assay is intended as an aid in the diagnosis of influenza from Nasopharyngeal swab specimens and should not be used as a sole basis for treatment. Nasal washings and aspirates are unacceptable for Xpert Xpress SARS-CoV-2/FLU/RSV testing.  Fact Sheet for Patients: EntrepreneurPulse.com.au  Fact Sheet for Healthcare Providers: IncredibleEmployment.be  This test is not yet approved or cleared by the [place] FDA and has been authorized for detection and/or diagnosis of SARS-CoV-2 by FDA under an Emergency Use Authorization (EUA). This EUA will remain in effect (meaning this test can be used) for the duration of the COVID-19 declaration under Section 564(b)(1) of the Act, 21 U.S.C. section 360bbb-3(b)(1), unless the authorization is terminated or revoked.  Performed at [facility], [place] [address], [place], [place] [address]   MRSA PCR Screening     Status: None   Collection Time: 12/31/20  6:38 PM   Specimen: Nasopharyngeal  Result Value Ref Range Status   MRSA by PCR NEGATIVE NEGATIVE Final    Comment:        The GeneXpert MRSA Assay (FDA approved for NASAL specimens only), is one component of a comprehensive MRSA colonization surveillance program. It is not intended to diagnose MRSA infection nor to guide or monitor treatment for MRSA infections. Performed at [facility], [place] [address], [place], [place] [address]      Time coordinating discharge: 38 minutes  SIGNED:   Georgette Shell, MD  [facility] 01/02/2021, 11:11 AM

## 2021-01-02 NOTE — Evaluation (Signed)
Physical Therapy Evaluation Patient Details Name: Pamela Moody MRN: [id number] DOB: [birth year] Today's Date: 01/02/2021   History of Present Illness  87 year old female with a history of permanent atrial fibrillation on Eliquis, hypertension, diverticulitis, fall risk, LBBB, cardiomyopathy who presented to the ED with worsening RLQ and RUQ abdominal pain x 3 days. Dx of sepsis 2* appendicitis, s/p appendectomy 01/01/21  Clinical Impression  Pt ambulated 41' with RW with supervision. She stated she feels she can manage mobility at her ILF. She is ready to DC home from PT standpoint.    Follow Up Recommendations Home health PT    Equipment Recommendations  None recommended by PT    Recommendations for Other Services       Precautions / Restrictions Precautions Precautions: Fall Precaution Comments: pt denies falls in past 6 months Restrictions Weight Bearing Restrictions: No      Mobility  Bed Mobility Overal bed mobility: Modified Independent             General bed mobility comments: HOB up, used rail    Transfers Overall transfer level: Needs assistance Equipment used: Rolling walker (2 wheeled) Transfers: Sit to/from Stand Sit to Stand: Supervision         General transfer comment: no assist needed, VCs hand placement  Ambulation/Gait Ambulation/Gait assistance: Supervision Gait Distance (Feet): 80 Feet Assistive device: Rolling walker (2 wheeled) Gait Pattern/deviations: Step-through pattern;Decreased stride length Gait velocity: WFL   General Gait Details: steady with RW  Stairs            Wheelchair Mobility    Modified Rankin (Stroke Patients Only)       Balance Overall balance assessment: Modified Independent                                           Pertinent Vitals/Pain Pain Assessment: No/denies pain    Home Living Family/patient expects to be discharged to:: Private residence     Type of Home:  Independent living facility Home Access: Level entry     Home Layout: One level Home Equipment: Environmental consultant - 4 wheels;Shower seat      Prior Function Level of Independence: Independent with assistive device(s)         Comments: walks with rollator     Hand Dominance        Extremity/Trunk Assessment   Upper Extremity Assessment Upper Extremity Assessment: Overall WFL for tasks assessed    Lower Extremity Assessment Lower Extremity Assessment: Overall WFL for tasks assessed    Cervical / Trunk Assessment Cervical / Trunk Assessment: Normal  Communication   Communication: HOH  Cognition Arousal/Alertness: Awake/alert Behavior During Therapy: WFL for tasks assessed/performed Overall Cognitive Status: Within Functional Limits for tasks assessed                                        General Comments      Exercises     Assessment/Plan    PT Assessment All further PT needs can be met in the next venue of care  PT Problem List Decreased mobility;Decreased activity tolerance       PT Treatment Interventions      PT Goals (Current goals can be found in the Care Plan section)  Acute Rehab PT Goals Patient Stated Goal: return  to ILF PT Goal Formulation: All assessment and education complete, DC therapy    Frequency     Barriers to discharge        Co-evaluation               AM-PAC PT "6 Clicks" Mobility  Outcome Measure Help needed turning from your back to your side while in a flat bed without using bedrails?: A Little Help needed moving from lying on your back to sitting on the side of a flat bed without using bedrails?: A Little Help needed moving to and from a bed to a chair (including a wheelchair)?: A Little Help needed standing up from a chair using your arms (e.g., wheelchair or bedside chair)?: A Little Help needed to walk in hospital room?: A Little Help needed climbing 3-5 steps with a railing? : A Little 6 Click Score:  18    End of Session Equipment Utilized During Treatment: Gait belt Activity Tolerance: Patient tolerated treatment well Patient left: in chair;with call bell/phone within reach;with chair alarm set Nurse Communication: Mobility status PT Visit Diagnosis: Difficulty in walking, not elsewhere classified (R26.2)    Time: 2355-7322 PT Time Calculation (min) (ACUTE ONLY): 20 min   Charges:   PT Evaluation $PT Eval Low Complexity: 1 Low         Philomena Doheny PT 01/02/2021  Acute Rehabilitation Services Pager [phone] Office [phone]

## 2021-01-02 NOTE — TOC Progression Note (Signed)
Transition of Care [facility]) - Progression Note    Patient Details  Name: Pamela Moody MRN: [id number] Date of Birth: 01-Apr-1922  Transition of Care [facility]) CM/SW Contact  Armanda Heritage, RN Phone Number: 01/02/2021, 12:26 PM  Clinical Narrative:    PT[facility] transportation arranged.   Expected Discharge Plan: Home/Self Care Barriers to Discharge: No Barriers Identified  Expected Discharge Plan and Services Expected Discharge Plan: Home/Self Care   Discharge Planning Services: CM Consult   Living arrangements for the past 2 months: Independent Living Facility Expected Discharge Date: 01/02/21                                     Social Determinants of Health (SDOH) Interventions    Readmission Risk Interventions No flowsheet data found.

## 2021-01-02 NOTE — Progress Notes (Signed)
Cardiology Progress Note  Patient ID: Pamela Moody MRN: [id number] DOB: June 16, 1922 Date of Encounter: 01/02/2021  Primary Cardiologist: No primary care provider on file.  Subjective   Chief Complaint: None.  Doing well.  HPI: Status post appendectomy.  A. fib appears to be rate controlled.  She reports she is weak and fatigued when she moves.  No evidence of volume overload.  ROS:  All other ROS reviewed and negative. Pertinent positives noted in the HPI.     Inpatient Medications  Scheduled Meds: . acetaminophen  650 mg Oral Q6H  . heparin  5,000 Units Subcutaneous Q8H  . lip balm      . metoprolol succinate  25 mg Oral Daily  . sodium chloride flush  3 mL Intravenous Q12H   Continuous Infusions:  PRN Meds: albuterol, fentaNYL (SUBLIMAZE) injection, ondansetron (ZOFRAN) IV, traMADol   Vital Signs   Vitals:   01/01/21 1633 01/01/21 2111 01/01/21 2201 01/02/21 0622  BP: 132/83 (!) 144/108  (!) 158/99  Pulse: 99 100  82  Resp: 18 18  20   Temp: (!) 97.4 F (36.3 C)  (!) 97.4 F (36.3 C) 97.8 F (36.6 C)  TempSrc: Oral  Oral Oral  SpO2: 90% 96%  95%  Weight:      Height:        Intake/Output Summary (Last 24 hours) at 01/02/2021 0827 Last data filed at 01/02/2021 0653 Gross per 24 hour  Intake 950 ml  Output 1625 ml  Net -675 ml   Last 3 Weights 12/31/2020 08/01/2012  Weight (lbs) 213 lb 13.5 oz 213 lb  Weight (kg) 97 kg 96.616 kg      Telemetry  Overnight telemetry shows atrial fibrillation with heart rate in the 80s, which I personally reviewed.   ECG  The most recent ECG shows A. fib heart rate 102, left bundle branch block, which I personally reviewed.   Physical Exam   Vitals:   01/01/21 1633 01/01/21 2111 01/01/21 2201 01/02/21 0622  BP: 132/83 (!) 144/108  (!) 158/99  Pulse: 99 100  82  Resp: 18 18  20   Temp: (!) 97.4 F (36.3 C)  (!) 97.4 F (36.3 C) 97.8 F (36.6 C)  TempSrc: Oral  Oral Oral  SpO2: 90% 96%  95%  Weight:      Height:          Intake/Output Summary (Last 24 hours) at 01/02/2021 0827 Last data filed at 01/02/2021 0653 Gross per 24 hour  Intake 950 ml  Output 1625 ml  Net -675 ml    Last 3 Weights 12/31/2020 08/01/2012  Weight (lbs) 213 lb 13.5 oz 213 lb  Weight (kg) 97 kg 96.616 kg    Body mass index is 40.41 kg/m.  General: Well nourished, well developed, in no acute distress Head: Atraumatic, normal size  Eyes: PEERLA, EOMI  Neck: Supple, no JVD Endocrine: No thryomegaly Cardiac: Normal S1, S2; irregular rhythm Lungs: Diminished breath sounds at the lung bases Abd: Soft, nontender, no hepatomegaly  Ext: No edema, pulses 2+ Musculoskeletal: No deformities, BUE and BLE strength normal and equal Skin: Warm and dry, no rashes   Neuro: Alert and oriented to person, place, time, and situation, CNII-XII grossly intact, no focal deficits  Psych: Normal mood and affect   Labs  High Sensitivity Troponin:  No results for input(s): TROPONINIHS in the last 720 hours.   Cardiac EnzymesNo results for input(s): TROPONINI in the last 168 hours. No results for input(s): TROPIPOC in the  last 168 hours.  Chemistry Recent Labs  Lab 12/31/20 0300 01/01/21 0600  NA 137 138  K 4.8 3.9  CL 103 102  CO2 25 27  GLUCOSE 120* 90  BUN 16 19  CREATININE 0.95 0.99  CALCIUM 9.1 8.2*  PROT 7.6  --   ALBUMIN 4.1  --   AST 26  --   ALT 9  --   ALKPHOS 56  --   BILITOT 2.2*  --   GFRNONAA 55* 52*  ANIONGAP 9 9    Hematology Recent Labs  Lab 12/31/20 0300 01/01/21 0600 01/02/21 0650  WBC 13.6* 9.4 11.8*  RBC 5.01 4.48 4.69  HGB 15.5* 13.8 14.5  HCT 48.2* 43.6 45.8  MCV 96.2 97.3 97.7  MCH 30.9 30.8 30.9  MCHC 32.2 31.7 31.7  RDW 14.0 14.0 13.8  PLT 225 187 209   BNPNo results for input(s): BNP, PROBNP in the last 168 hours.  DDimer No results for input(s): DDIMER in the last 168 hours.   Radiology  ECHOCARDIOGRAM COMPLETE  Result Date: 12/31/2020    ECHOCARDIOGRAM REPORT   Patient Name:   Donnita FallsLOUISE  M Lizak Date of Exam: 12/31/2020 Medical Rec #:  [id number]          Height:       61.0 in Accession #:    [id number]         Weight:       213.8 lb Date of Birth:  01/22/1922           BSA:          1.943 m Patient Age:    87 years           BP:           126/68 mmHg Patient Gender: F                  HR:           81 bpm. Exam Location:  Inpatient Procedure: 2D Echo, Cardiac Doppler and Color Doppler Indications:    Dyspnea  History:        Patient has no prior history of Echocardiogram examinations.                 Cardiomegaly, Arrythmias:Atrial Fibrillation and LBBB,                 Signs/Symptoms:Abdominal pain, appendicitis and Dyspnea; Risk                 Factors:Hypertension and obesity.  Sonographer:    Lavenia AtlasBrooke Strickland Referring Phys: [id number] JARED E SEGAL  Sonographer Comments: Patient is morbidly obese. IMPRESSIONS  1. Left ventricular ejection fraction, by estimation, is 40 to 45%. The left ventricle has mildly decreased function. The left ventricle demonstrates global hypokinesis. There is moderate concentric left ventricular hypertrophy. Left ventricular diastolic function could not be evaluated.  2. Right ventricular systolic function is normal. The right ventricular size is normal. There is normal pulmonary artery systolic pressure. The estimated right ventricular systolic pressure is 33.9 mmHg.  3. Left atrial size was moderately dilated.  4. Right atrial size was mildly dilated.  5. The mitral valve is normal in structure. No evidence of mitral valve regurgitation. No evidence of mitral stenosis.  6. The aortic valve is calcified. There is mild calcification of the aortic valve. There is mild thickening of the aortic valve. Aortic valve regurgitation is not visualized. No aortic stenosis is present.  7. The inferior vena  cava is normal in size with greater than 50% respiratory variability, suggesting right atrial pressure of 3 mmHg. FINDINGS  Left Ventricle: Left ventricular ejection fraction,  by estimation, is 40 to 45%. The left ventricle has mildly decreased function. The left ventricle demonstrates global hypokinesis. The left ventricular internal cavity size was normal in size. There is  moderate concentric left ventricular hypertrophy. Abnormal (paradoxical) septal motion, consistent with left bundle branch block. Left ventricular diastolic function could not be evaluated due to atrial fibrillation. Left ventricular diastolic function could not be evaluated. Right Ventricle: The right ventricular size is normal. No increase in right ventricular wall thickness. Right ventricular systolic function is normal. There is normal pulmonary artery systolic pressure. The tricuspid regurgitant velocity is 2.78 m/s, and  with an assumed right atrial pressure of 3 mmHg, the estimated right ventricular systolic pressure is 33.9 mmHg. Left Atrium: Left atrial size was moderately dilated. Right Atrium: Right atrial size was mildly dilated. Pericardium: There is no evidence of pericardial effusion. Mitral Valve: The mitral valve is normal in structure. No evidence of mitral valve regurgitation. No evidence of mitral valve stenosis. Tricuspid Valve: The tricuspid valve is normal in structure. Tricuspid valve regurgitation is mild . No evidence of tricuspid stenosis. Aortic Valve: The aortic valve is calcified. There is mild calcification of the aortic valve. There is mild thickening of the aortic valve. Aortic valve regurgitation is not visualized. No aortic stenosis is present. Pulmonic Valve: The pulmonic valve was normal in structure. Pulmonic valve regurgitation is not visualized. No evidence of pulmonic stenosis. Aorta: The aortic root is normal in size and structure. Venous: The inferior vena cava is normal in size with greater than 50% respiratory variability, suggesting right atrial pressure of 3 mmHg. IAS/Shunts: No atrial level shunt detected by color flow Doppler.  LEFT VENTRICLE PLAX 2D LVIDd:          4.55 cm  Diastology LVIDs:         3.93 cm  LV e' medial:    4.68 cm/s LV PW:         1.30 cm  LV E/e' medial:  18.7 LV IVS:        1.36 cm  LV e' lateral:   3.81 cm/s LVOT diam:     1.90 cm  LV E/e' lateral: 22.9 LV SV:         39 LV SV Index:   20 LVOT Area:     2.84 cm  RIGHT VENTRICLE RV Basal diam:  3.44 cm RV S prime:     10.60 cm/s TAPSE (M-mode): 3.6 cm LEFT ATRIUM         Index LA diam:    5.00 cm 2.57 cm/m  AORTIC VALVE LVOT Vmax:   78.30 cm/s LVOT Vmean:  58.600 cm/s LVOT VTI:    0.137 m  AORTA Ao Root diam: 3.10 cm MITRAL VALVE               TRICUSPID VALVE MV Area (PHT): 4.31 cm    TR Peak grad:   30.9 mmHg MV Decel Time: 176 msec    TR Vmax:        278.00 cm/s MV E velocity: 87.30 cm/s MV A velocity: 23.80 cm/s  SHUNTS MV E/A ratio:  3.67        Systemic VTI:  0.14 m  Systemic Diam: 1.90 cm Tobias Alexander MD Electronically signed by Tobias Alexander MD Signature Date/Time: 12/31/2020/3:52:50 PM    Final     Cardiac Studies  TTE 12/31/2020  1. Left ventricular ejection fraction, by estimation, is 40 to 45%. The  left ventricle has mildly decreased function. The left ventricle  demonstrates global hypokinesis. There is moderate concentric left  ventricular hypertrophy. Left ventricular  diastolic function could not be evaluated.  2. Right ventricular systolic function is normal. The right ventricular  size is normal. There is normal pulmonary artery systolic pressure. The  estimated right ventricular systolic pressure is 33.9 mmHg.  3. Left atrial size was moderately dilated.  4. Right atrial size was mildly dilated.  5. The mitral valve is normal in structure. No evidence of mitral valve  regurgitation. No evidence of mitral stenosis.  6. The aortic valve is calcified. There is mild calcification of the  aortic valve. There is mild thickening of the aortic valve. Aortic valve  regurgitation is not visualized. No aortic stenosis is present.  7. The  inferior vena cava is normal in size with greater than 50%  respiratory variability, suggesting right atrial pressure of 3 mmHg.   Patient Profile  Pamela Moody is a 87 y.o. female with permanent atrial fibrillation who was admitted on 12/31/2020 with acute appendicitis.  Cardiology was consulted for EF 40-45%.  Assessment & Plan   1.  Permanent atrial fibrillation -I have added metoprolol succinate 25 mg daily. -Restart Eliquis when deemed appropriate per surgery. -No evidence of RVR.  She is stable.  2.  Systolic heart failure, EF 40-45%/left bundle branch block -Transition to metoprolol succinate as above. -She is euvolemic on examination.  No further diuresis needed. -She can take 20 mg of p.o. Lasix as needed. -BP has been elevated.  We will add losartan 25 mg daily.  I would not treat her cardiomyopathy aggressively.  She really has no symptoms of due to her advanced age she is not a great candidate for aggressive cardiovascular care.  Furthermore, she reports she is not interested in aggressive medical care.  [facility] will sign off.   Medication Recommendations: Medications as above. Other recommendations (labs, testing, etc): None. Follow up as an outpatient: She may follow with her outpatient cardiologist Dr. Judithe Modest at discharge.  For questions or updates, please contact [facility] Please consult [email or web address] for contact info under   Time Spent with Patient: I have spent a total of 25 minutes with patient reviewing hospital notes, telemetry, EKGs, labs and examining the patient as well as establishing an assessment and plan that was discussed with the patient.  > 50% of time was spent in direct patient care.    Signed, Lenna Gilford. Flora Lipps, MD, [facility] [place]  [facility]  01/02/2021 8:27 AM

## 2021-01-02 NOTE — Progress Notes (Signed)
Patient discharged to home via PTAR, discharge instructions reviewed with patient who verbalized understanding. RX sent with patient.

## 2021-01-04 LAB — SURGICAL PATHOLOGY

## 2022-09-16 IMAGING — CT CT ABD-PELV W/ CM
2 of 5 series · 17 of 46 positions shown, 19 images · IV contrast (OMNIPAQUE 300)
Comparison: None.

CLINICAL DATA: Right lower quadrant pain.

EXAM:
CT ABDOMEN AND PELVIS WITH CONTRAST
TECHNIQUE: Multidetector CT imaging of the abdomen and pelvis was performed
using the standard protocol following bolus administration of
intravenous contrast.
CONTRAST:  100mL OMNIPAQUE IOHEXOL 300 MG/ML  SOLN

[Series 2: axial st · axial · 0.68mm/px · z∈[+1123,+1473]mm · 14 of 82 slices shown, 16 images]
[im 6/82  soft-tissue]
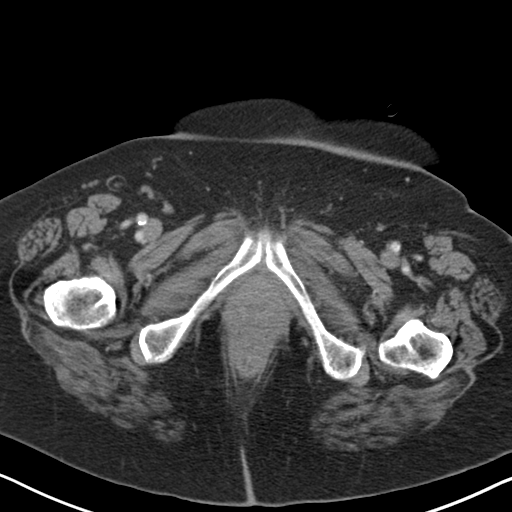
[im 6/82  bone]
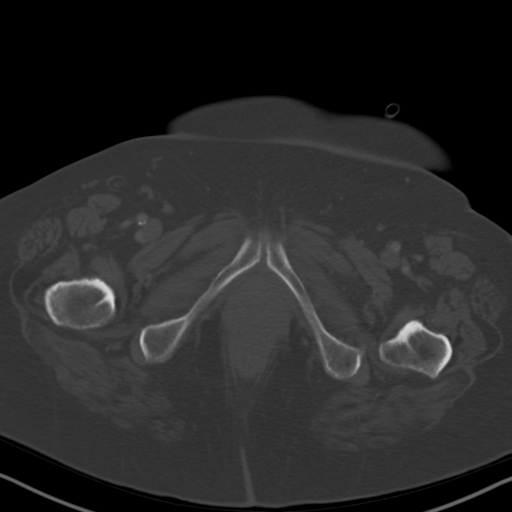
[im 11/82  soft-tissue]
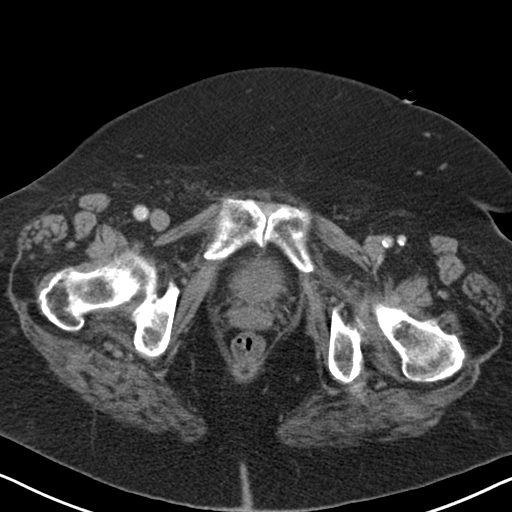
[im 17/82  soft-tissue]
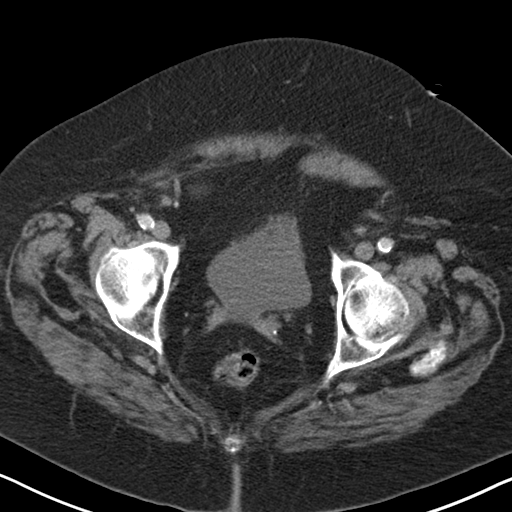
[im 22/82  soft-tissue]
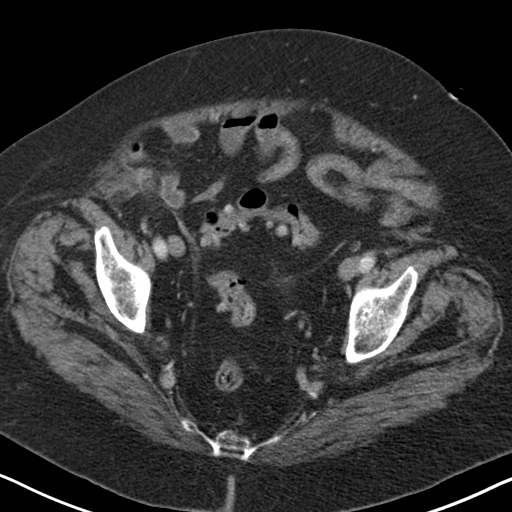
[im 28/82  soft-tissue]
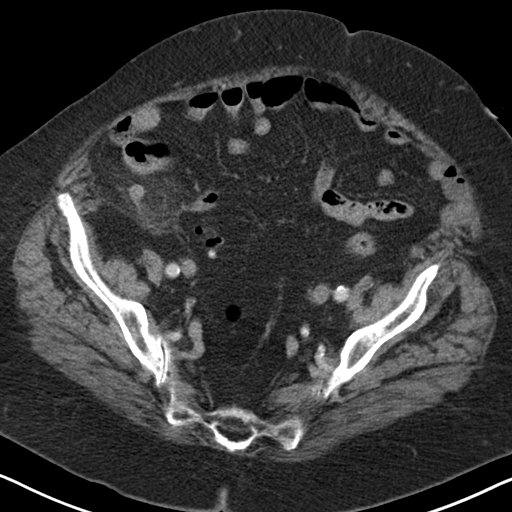
[im 33/82  soft-tissue]
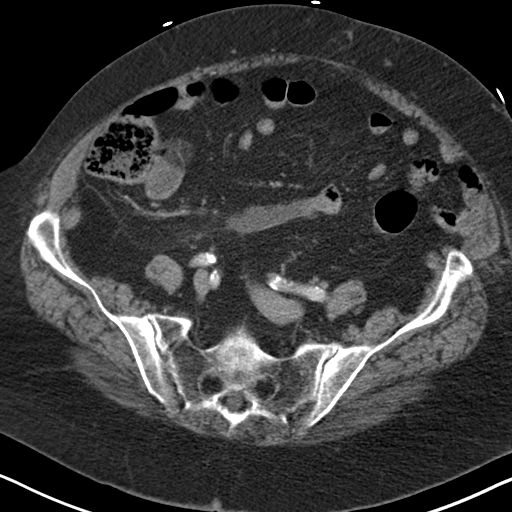
[im 38/82  soft-tissue]
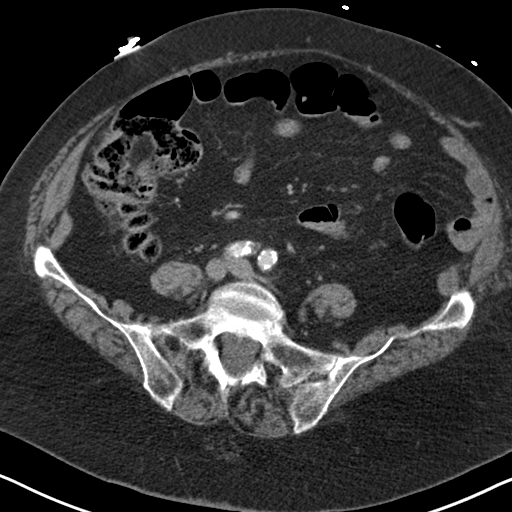
[im 44/82  soft-tissue]
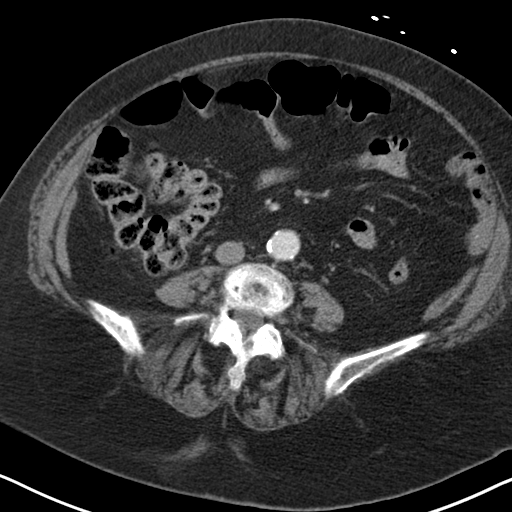
[im 49/82  soft-tissue]
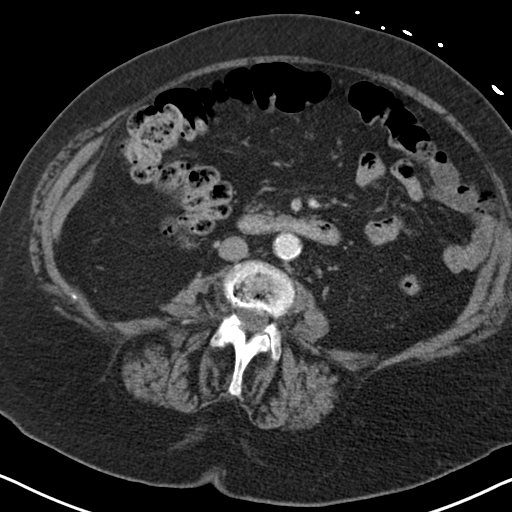
[im 49/82  bone]
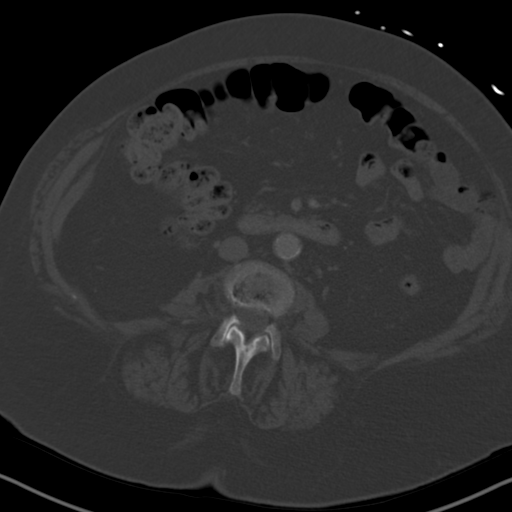
[im 55/82  soft-tissue]
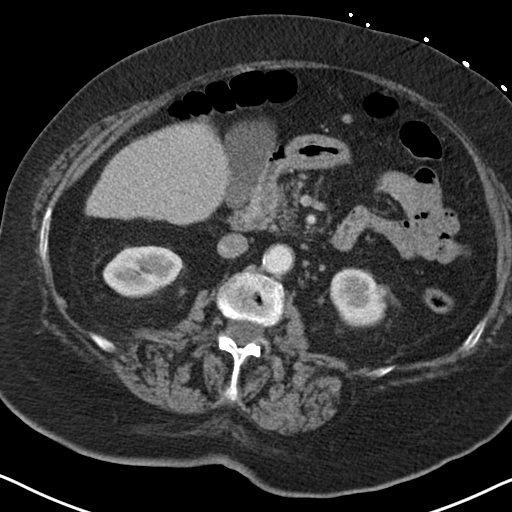
[im 60/82  soft-tissue]
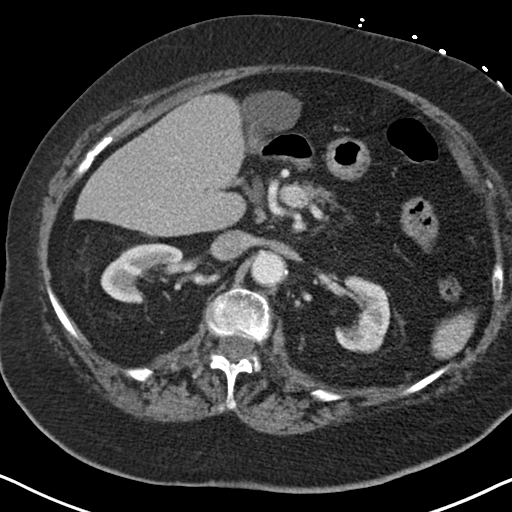
[im 65/82  soft-tissue]
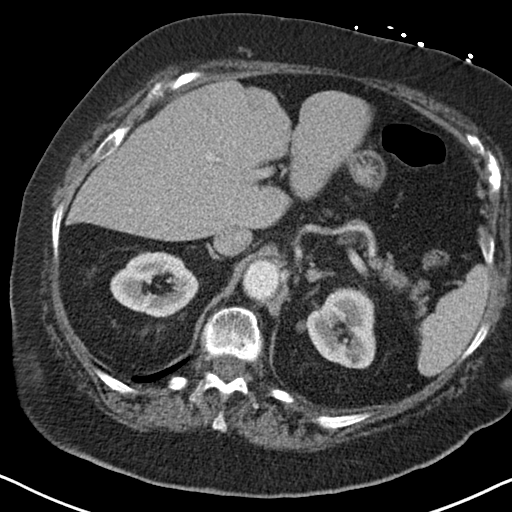
[im 71/82  soft-tissue]
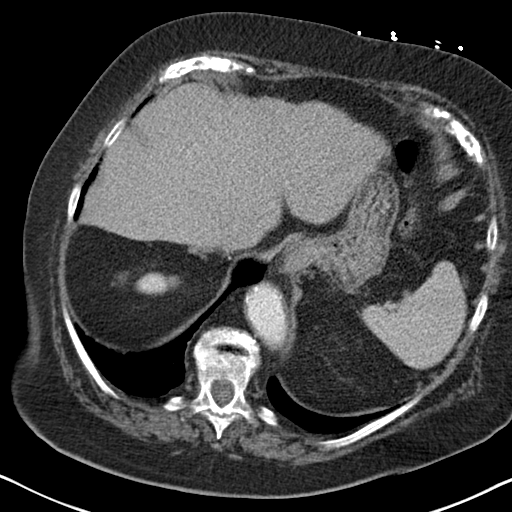
[im 76/82  soft-tissue]
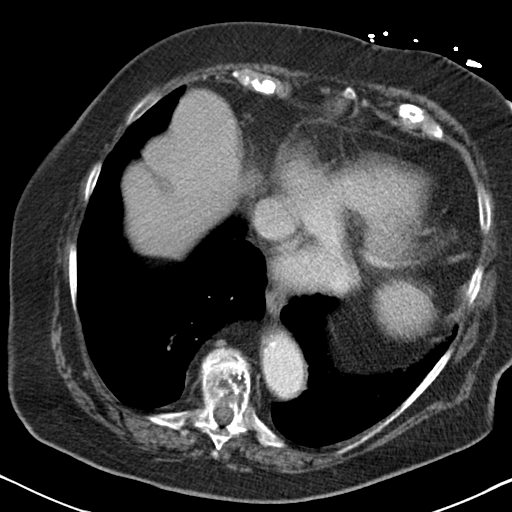

[Series 4: coronal st · coronal · 0.78mm/px · 3 of 160 slices shown]
[im 54/160  soft-tissue]
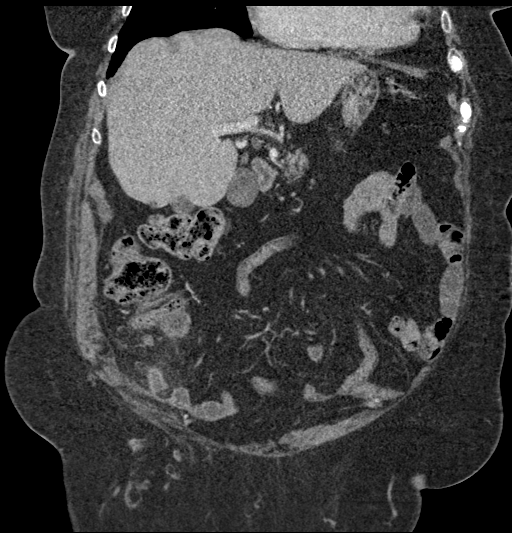
[im 71/160  soft-tissue]
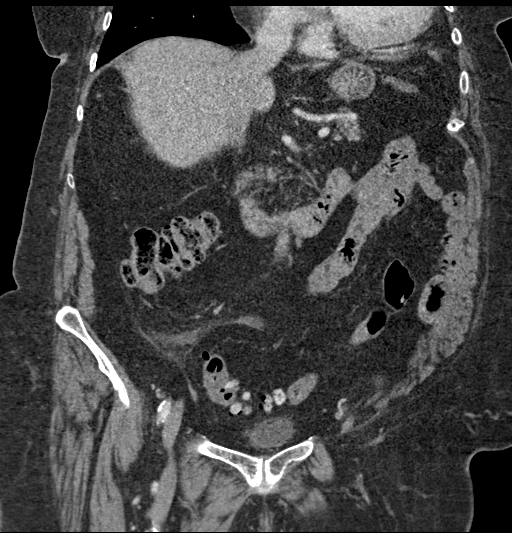
[im 89/160  soft-tissue]
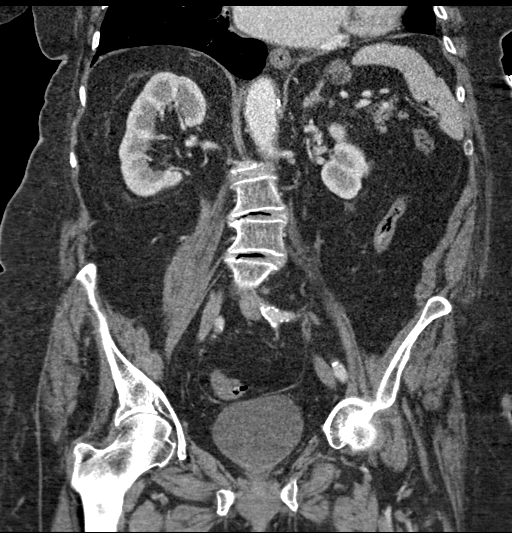

[17 of 46 positions shown; findings below may reference images not displayed]

FINDINGS: Lower chest:  No acute finding.  Aortic atherosclerosis.

Hepatobiliary: Exophytic cysts from the inferior right liver
measuring up to 2 cm where there is minimal peripheral calcification
seen on coronal reformats.No evidence of biliary obstruction or
stone.

Pancreas: Generalized atrophy

Spleen: Unremarkable.

Adrenals/Urinary Tract: Negative adrenals. No hydronephrosis or
stone. Unremarkable bladder.

Stomach/Bowel: Thick walled appendix with mesoappendiceal fat
stranding and 11 mm outer wall diameter. A tiny appendicolith could
be present towards the base on reformats. Subtle lobulation and
possible wall thinning present at the tip, but without abscess or
extraluminal gas. The appendix is in expected location in the right
lower quadrant. No bowel obstruction/ileus.

Vascular/Lymphatic: Diffuse atheromatous changes, expected for age.
No mass or adenopathy.

Reproductive:Hysterectomy

Other: No ascites or pneumoperitoneum.

Musculoskeletal: Lumbar spine degeneration with exaggerated lumbar
lordosis. T10 hemangioma.
IMPRESSION: Acute suppurative appendicitis. There may be wall thinning towards
the tip but no discrete perforation or abscess.
